# Patient Record
Sex: Male | Born: 1935 | Race: White | Hispanic: No | Marital: Married | Smoking: Former smoker
Health system: Southern US, Community
[De-identification: ages and names within clinical notes are randomized; demographics above are authoritative.]

## PROBLEM LIST (undated history)

## (undated) DIAGNOSIS — J449 Chronic obstructive pulmonary disease, unspecified: Secondary | ICD-10-CM

## (undated) DIAGNOSIS — S2239XA Fracture of one rib, unspecified side, initial encounter for closed fracture: Secondary | ICD-10-CM

## (undated) DIAGNOSIS — B029 Zoster without complications: Secondary | ICD-10-CM

## (undated) DIAGNOSIS — R296 Repeated falls: Secondary | ICD-10-CM

## (undated) DIAGNOSIS — K5190 Ulcerative colitis, unspecified, without complications: Secondary | ICD-10-CM

## (undated) DIAGNOSIS — K7290 Hepatic failure, unspecified without coma: Secondary | ICD-10-CM

## (undated) HISTORY — PX: ERCP: SHX60

## (undated) HISTORY — PX: OTHER SURGICAL HISTORY: SHX169

## (undated) HISTORY — PX: BACK SURGERY: SHX140

## (undated) HISTORY — PX: CHOLECYSTECTOMY: SHX55

---

## 2005-07-26 ENCOUNTER — Ambulatory Visit: Payer: Self-pay | Admitting: Internal Medicine

## 2005-08-15 ENCOUNTER — Encounter: Admission: RE | Admit: 2005-08-15 | Discharge: 2005-08-15 | Payer: Self-pay | Admitting: Gastroenterology

## 2005-09-29 ENCOUNTER — Ambulatory Visit: Payer: Self-pay | Admitting: Internal Medicine

## 2006-07-30 ENCOUNTER — Inpatient Hospital Stay (HOSPITAL_COMMUNITY): Admission: EM | Admit: 2006-07-30 | Discharge: 2006-07-31 | Payer: Self-pay | Admitting: Emergency Medicine

## 2006-07-31 ENCOUNTER — Ambulatory Visit: Payer: Self-pay | Admitting: *Deleted

## 2006-10-31 ENCOUNTER — Encounter: Admission: RE | Admit: 2006-10-31 | Discharge: 2006-10-31 | Payer: Self-pay | Admitting: Internal Medicine

## 2007-01-25 ENCOUNTER — Encounter: Admission: RE | Admit: 2007-01-25 | Discharge: 2007-01-25 | Payer: Self-pay | Admitting: Gastroenterology

## 2007-08-08 ENCOUNTER — Encounter: Admission: RE | Admit: 2007-08-08 | Discharge: 2007-08-08 | Payer: Self-pay | Admitting: Gastroenterology

## 2008-01-31 ENCOUNTER — Inpatient Hospital Stay (HOSPITAL_COMMUNITY): Admission: EM | Admit: 2008-01-31 | Discharge: 2008-02-01 | Payer: Self-pay | Admitting: Emergency Medicine

## 2008-01-31 ENCOUNTER — Ambulatory Visit: Payer: Self-pay | Admitting: Cardiology

## 2010-03-07 ENCOUNTER — Encounter: Payer: Self-pay | Admitting: Gastroenterology

## 2010-06-29 NOTE — Discharge Summary (Signed)
[name], [name]              ACCOUNT NO.:  [id number]   MEDICAL RECORD NO.:  [id number]          PATIENT TYPE:  INP   LOCATION:  4729                         FACILITY:  [facility]   PHYSICIAN:  Theodosia Paling, MD    DATE OF BIRTH:  Jun 18, 1935   DATE OF ADMISSION:  01/31/2008  DATE OF DISCHARGE:                               DISCHARGE SUMMARY   PRIMARY CARE PHYSICIAN:  Georgianne Fick, MD   ADMITTING HISTORY:  Please refer to the x-ray and admission note  dictated by Dr. Isidor Holts, dated 15th and 17th December 2009.   HOSPITAL COURSE:  Following issues were addressed during the  hospitalization:  1. Chest pain.  The patient underwent cardiac enzymes evaluation and      tele evaluation.  He became asymptomatic.  He also underwent a      stress test.  Dr. Vernice Jefferson, from Cardiology evaluated the patient.      At the time of discharge, the patient is hemodynamically stable.      He is asymptomatic.  He also underwent a CT scan to evaluate for      pulmonary embolism given his elevated D-dimer, which was also      negative.  Most likely, the pain is secondary to the      gastroesophageal acid reflux and the patient is recommended to      continue to take his Nexium and Maalox as needed along with      Percocet in case it is a musculoskeletal pain.  Incase, he were to      have similar symptoms again, he can report back to emergency room.      He will be following with Dr. Nicholos Johns for the above issues and      as per his previous schedule.  2. Cirrhosis, that remained compensated.  3. COPD.  His COPD is stable.  4. Ulcerative colitis.  He remained asymptomatic from that.   DISCHARGE MEDICATIONS:  The patient is instructed to continue his home  medications, which include:  1. Ursodiol 300 mg p.o. q.i.d.  2. Nexium 40 mg p.o. daily.  3. Citalopram 20 mg p.o. daily.  4. Fish oil 1200 mg p.o. daily.  5. Desioxomethasone 0.25% cream topically to lower extremities daily.  6. Apriso-ER 0.375 g p.o. q.i.d.  7. Entocort-EC 3 mg p.o. q.i.d.  8. Aridium 1 p.o. daily.   PROCEDURE PERFORMED:  Nuclear cardiac stress test, which was negative.   CONSULTATION PERFORMED:  Dr. Vernice Jefferson from Cardiology on February 01, 2008, evaluated the patient.   IMAGING PERFORMED:  1. A CT scan with IV contrast, which ruled out pulmonary embolism.  2. Chest x-ray on January 31, 2008, which was essentially negative.   DISPOSITION:  The patient will follow up with the primary care physician  as scheduled.   Total time spent in discharge of this patient, 35 minutes.   DISCHARGE DIAGNOSES:  1. Chest pain.  2. Cirrhosis.  3. Chronic obstructive pulmonary embolism.  4. Ulcerative colitis.      Theodosia Paling, MD  Electronically Signed     NP/MEDQ  D:  02/01/2008  T:  02/02/2008  Job:  [id number]   cc:   Georgianne Fick, M.D.

## 2010-06-29 NOTE — Discharge Summary (Signed)
[name], [name]              ACCOUNT NO.:  [id number]   MEDICAL RECORD NO.:  [id number]          PATIENT TYPE:  INP   LOCATION:  3703                         FACILITY:  [facility]   PHYSICIAN:  Marcellus Scott, MD     DATE OF BIRTH:  1935/02/20   DATE OF ADMISSION:  07/30/2006  DATE OF DISCHARGE:  07/31/2006                               DISCHARGE SUMMARY   PRIMARY MEDICAL DOCTOR:  Georgianne Fick, M.D.   GASTROENTEROLOGIST:  Llana Aliment. Randa Evens, M.D.   PULMONOLOGIST:  Charlaine Dalton. Sherene Sires, MD, FCCP.   DISCHARGE DIAGNOSES:  1. Orthostatic hypotension.  2. Chest discomfort.  3. Questionable right upper lobe pulmonary nodule - ruled out by CT      chest.  4. Acute prerenal failure.  5. Chronic obstructive pulmonary disease.  6. Tobacco abuse.  7. Dyslipidemia.  8. Abnormal LFTs, cirrhosis, fatty liver.  9. 6 mm pleural based nodule in right upper lobe- consider outpatient      evaluation as deemed necessary  (has been discussed with patients      PMD)  10.Abnormal areas in left kidney on CT scan - consider outpatient      evaluation   DISCHARGE MEDICATIONS:  1. Enteric-coated aspirin 81 mg p.o. daily.  2. Prilosec over-the-counter, 20 mg p.o. daily.   MEDICATIONS HELD:  Micardis.   PROCEDURES:  1. CT angiogram of the chest.  Preliminary report with no pulmonary      embolism, severe emphysema, cirrhotic liver, left renal abnormality      - renal ultrasound or CT recommended.  2. Chest x-ray that was done on June 15th.  Impression:  Nodular      opacity in the right upper lung field, recommend comparison with      prior or followup chest x-ray versus CT of the chest, mild      cardiomegaly.  3. Echocardiogram which was done on June 16th, the report of which is      pending and is to be followed up by the primary medical doctor as      an outpatient - Normal LV systolic function with EF 60-65% and mild      aortic regurgitation.   PERTINENT LABORATORY RESULTS:  Lipid panel  with cholesterol of 250,  triglycerides 130, HDL 39, LDL 185, VLDL 26.  A cardiac panel with  negative CK-MB and troponin.  CPK 340, which is decreasing from 346.  A  complete metabolic panel normal with a BUN of 17 and creatinine of 1.  The patient had a creatinine of 1.5 on admission.  TSH of 1.418.  Hepatic panel remarkable for a total bilirubin of 1.6, alkaline  phosphatase of 405, AST 51, ALT 57, total protein 7.5, albumin 3.2.  BNP  of 80.  D-dimer 1.  Urinalysis with 30 mg/dL of protein.  CBC with a  hemoglobin of 15, hematocrit of 45, white blood cells 13, platelet count  of 210, MCV of 87.   CONSULTATIONS:  None.   HOSPITAL COURSE AND PATIENT DISPOSITION:  For details of the initial  part of the admission, please refer to the  history and physical note  that was done by Dr. Waymon Amato on 30 July 2006.  In summary, [name]  is a pleasant, 75 year old, male patient with a history of hypertension,  dyslipidemia, chronic obstructive pulmonary disease, chronic smoker with  a strong family history of smoking and lung cancer deaths.  He presented  with a history of dizziness, lightheadedness, blurred vision, chest  discomfort, and dyspnea while playing golf.  He was evaluated and noted  to have orthostatic hypotension, whereby he was admitted for further  evaluation and management.   1. Orthostatic hypotension.  The patient had not been eating or      drinking properly for 12 hours prior to the event.  He had      continued to take his Micardis, and he was also out playing golf      with his family on Father's Day in the hot and humid weather.  This      could have contributed to dehydration with associated orthostatic      hypotension.  The patient was admitted to the hospital to a      telemetry bed and had no alarms.  His Micardis was held.  He was      placed on IV fluid hydration with which his hypotension promptly      resolved.  The patient is currently asymptomatic of any  dizziness,      lightheadedness, or blurred vision and is ambulating without      assistance.  His Micardis will continue to be held until he is      reviewed by his primary care physician in a couple of days.  2. Chest discomfort.  With his history of dyslipidemia, chronic      smoking, he had some risk factors of cardiac disease, so he was      admitted to the hospital.  Cardiac enzymes were cycled which were      not consistent with an acute MI.  His D-dimer was done because of      his recent travel history to [place] and back via car.  His D-dimer      was positive, whereby a CT angiogram was done and it was negative      for pulmonary embolism.  I have discussed the CT angiogram report      with the radiologist today, who also indicated that he does not see      the right upper lobe pulmonary nodule which was reported on the      chest x-ray and he does not suggest a repeat CT of the chest at      this time.  Also, I have discussed with the patient's primary care      physician who indicated that a recent stress test done 3 weeks ago      was negative and the patient was able to do 10 minutes of exercise.      The patient's echocardiogram has been done today, the report of      which is pending and is to be followed up as an outpatient.  The      patient will be discharged on enteric-coated aspirin.  3. Right upper lobe questionable lung nodule which was ruled out by a      CT of the chest.  4. Acute prerenal failure secondary to causes mentioned in problem #1,      resolved with IV fluid hydration and holding his Micardis.  The  patient is advised to drink adequate amounts of liquids and his      basic metabolic panel will have to be followed up as an outpatient.  5. COPD which is stable.  6. Tobacco abuse.  Tobacco cessation counseling has been obtained.      According to the patient's spouse, however, the patient does not      want to quit smoking.  7. Dyslipidemia.  The  patient obviously would benefit from lipid-      lowering agents, like Statins, however, these to be started as an      outpatient because of his mildly elevated CK and his abnormal liver      function test.  8. Abnormal LFTs, cirrhosis, fatty liver.  The patient says that he      has been extensively evaluated in the past at the [facility] in      2004, with multiple ERCPs and biopsies and has chronically elevated      alkaline phosphatase and mildly abnormal liver function test which      he has been told to be due to fatty liver.  We will leave this to      the discretion of his primary physicians to work up as an      outpatient.  9. Right Upper Lobe Pleural based nodule on CT chest - for outpatient      workup as deemed necessary. This has been discussed with patients      PMD.  10.Abnormal areas in left kidney on CT scan chest - outpatient      evaluation as deemed necessary which may include ultrasound or CT      abdomen.   A Doppler of his lower extremities will be performed and if negative,  the patient will be discharged to follow up with his primary care  physician.  The patient has also been instructed to seek immediate  medical attention if there is any further deterioration in his  condition.  Venous Doppler of lower extremities negative for DVT.      Marcellus Scott, MD  Electronically Signed     AH/MEDQ  D:  07/31/2006  T:  07/31/2006  Job:  [id number]   cc:   Georgianne Fick, M.D.  James L. Malon Kindle., M.D.  Charlaine Dalton. Sherene Sires, MD, FCCP

## 2010-06-29 NOTE — Consult Note (Signed)
[name], [name]              ACCOUNT NO.:  [id number]   MEDICAL RECORD NO.:  [id number]          PATIENT TYPE:  INP   LOCATION:  4729                         FACILITY:  [facility]   PHYSICIAN:  Vernice Jefferson, MD          DATE OF BIRTH:  November 14, 1935   DATE OF CONSULTATION:  02/01/2008  DATE OF DISCHARGE:                                 CONSULTATION   CONSULTING Lismary Kiehn:  [facility].   REASON FOR CONSULTATION:  The patient is being seen at the request of  [facility] for evaluation of chest pain.   HISTORY OF PRESENT ILLNESS:  The patient is a 75 year old white male  with negative stress test in the past for atypical chest pain who comes  in with 3 days onset of chest pain that had woken him from sleep.  He  reports that he has left arm radiation with it, some shortness of  breath, minimally diaphoretic.  He reports that he has not had this  chest discomfort before, say for the past 3 days.  He currently is chest  pain free.  He denies any exertional component to this chest pain and  has been recently ill due to exacerbation of his ulcerative colitis and  frequent bouts of diarrhea.   PAST MEDICAL HISTORY:  1. Hypertension.  2. Ulcerative colitis.  3. COPD.  4. Cirrhosis of liver.  5. GERD.  6. Status post cholecystectomy.   MEDICATIONS:  1. Ursodiol 300 mg q.i.d.  2. Nexium 40 mg a day.  3. Citalopram 20 mg a day.  4. Fish oil 1.2 g a day.  5. Dexamethasone cream applied topically.  6. Entocort 3 q.i.d.   ALLERGIES:  AUGMENTIN, EGGS, and STATINS.   SOCIAL HISTORY:  Nonsmoker, nondrinker, non IV drug user.   FAMILY HISTORY:  Reviewed, noncontributory.  The patient had no medical  condition.   REVIEW OF SYSTEMS:  Negative 11-point review of systems except for those  dictated in the above HPI.   PHYSICAL EXAMINATION:  VITAL SIGNS:  Blood pressure is 127/63, heart  rate 60, respirations 12.  GENERAL:  A well-developed, well-nourished white  man in no acute  distress.  HEENT:  Moist mucous membranes. No scleral icterus.  No conjunctival  pallor.  NECK:  Supple.  Full range of motion.  No jugular venous distention.  CARDIOVASCULAR:  Regular rate and rhythm.  No murmurs, rubs, or gallops.  CHEST:  Clear to auscultation bilaterally.  No wheezes, rales, or  rhonchi.  ABDOMEN: Soft, nontender, nondistended.  Normoactive bowel sounds.  EXTREMITIES:  No peripheral edema.  Pulses are 2+ bilaterally.  NEUROLOGIC:  Nonfocal.   DIAGNOSTIC STUDIES:  The EKG demonstrates normal sinus rhythm with  incomplete right bundle-branch block which is unchanged for him.  There  is no acute ST or T-wave abnormalities.  Chest x-ray demonstrates no  acute infiltrative process or chronic changes.   LABORATORY DATA:  Significant for negative biomarkers.  BUN and  creatinine 12 and 0.9.  Hemoglobin of 12.9.   IMPRESSION:  1. Acute coronary syndrome, unstable angina.  2.  Inflammatory bowel disease.  3. History of cirrhosis.   PLAN:  I would agree with your current plans for following enzymes and  the telemetry monitoring.  We will hold heparin at this time and  although he does may have an acute coronary syndrome given his bleeding  risk from his cirrhosis and ultrasound colitis.  I think it probably  outweighs the benefit given his 3 days of chest pain with negative  biomarkers.  We would consider adenosine Myoview in the morning before  discharge, if negative, he can likely just followup for other noncardiac  causes of chest discomfort.  We will order an a.m. ECG.   Thank you very much for this consultation.      Vernice Jefferson, MD  Electronically Signed     JT/MEDQ  D:  02/01/2008  T:  02/01/2008  Job:  [id number]

## 2010-06-29 NOTE — H&P (Signed)
[name], [name]              ACCOUNT NO.:  [id number]   MEDICAL RECORD NO.:  [id number]          PATIENT TYPE:  INP   LOCATION:  4729                         FACILITY:  [facility]   PHYSICIAN:  Isidor Holts, M.D.  DATE OF BIRTH:  [birth year]   DATE OF ADMISSION:  01/31/2008  DATE OF DISCHARGE:                              HISTORY & PHYSICAL   PRIORITY ADMISSION HISTORY AND PHYSICAL   PRIMARY MEDICAL DOCTOR:  Georgianne Fick, MD.   PRIMARY GASTROENTEROLOGIST:  Llana Aliment. Randa Evens, MD.  Gastroenterologist at [facility] [place] D. Katrinka Blazing,  MD.   CHIEF COMPLAINT:  Recurrent chest pain for 3 days.   HISTORY OF PRESENT ILLNESS:  This is a 75 year old male.  For past  medical history, see below.  According to patient, in the night of  January 29, 2008, he had just gone to bed and was lying in bed.  Before  falling asleep, he developed the sudden onset of retrosternal chest  pain, radiating to the left arm and to the back.  This was associated  with mild diaphoresis, but no shortness of breath or nausea.  It lasted  approximately 1 hour, then resolved.  He remained asymptomatic until the  following night, when the same symptoms recurred in almost exactly the  same manner and duration.  This also subsided spontaneously.  At about  3:00 p.m. on January 31, 2008, while sitting in a chair, he suddenly  developed similar chest discomfort with radiation to the left arm.  This  lasted about 1 hour.  He informed his spouse, who then called EMS.  He  was administered 1 sublingual Nitroglycerin pill in the home, another en  route to the emergency department, and 1 on arrival at the emergency  department, with considerable amelioration of chest discomfort.  At the  time of this evaluation at about 10:30 p.m., the patient had residual  central chest discomfort, albeit very mild.   PAST MEDICAL HISTORY:  1. Hypertension.  2. Dyslipidemia.  3. COPD.  4. End-stage liver  disease/cirrhosis status post extensive workup at      [facility] including ERCP and      liver biopsy.  Etiology was ascribed to Augmentin, which he had      been placed on, in 1999.  5. GERD.  6. Ulcerative colitis, diagnosed approximately 2 weeks ago by      colonoscopy and biopsy.  7. Status post cholecystectomy.  8. Macular degeneration.  9. Eczema lower extremities.   MEDICATION HISTORY:  1. Ursodiol 300 mg p.o. q.i.d.  2. Nexium 40 mg p.o. daily.  3. Citalopram 20 mg p.o. daily.  4. Fish oil 1200 mg p.o. daily.  5. Desioxomethasone 0.25% cream topically to lower extremities daily.  6. Apriso-ER 0.375 g p.o. q.i.d.  7. Entocort-EC 3 mg p.o. q.i.d.  8. Aridium 1 p.o. daily.   ALLERGIES:  1. AUGMENTIN.  2. EGGS.  3. STATINS.   REVIEW OF SYSTEMS:  As per HPI and chief complaint.  The patient denies  abdominal pain, vomiting, or diarrhea.  Denies lower extremity  swelling.   SOCIAL HISTORY:  The patient is married.  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  He has been a long term smoker for approximately 55 years,  but has now quit smoking a couple of years ago.  He drinks alcohol only  socially, has no history of drug abuse.   FAMILY HISTORY:  Positive for COPD in a brother, and a sister, who is  now deceased.  The patient's father, mother, and grandmother died of  lung cancer.  They were all smokers.  The patient's daughter died at age  82 from Wilson's disease.   PHYSICAL EXAMINATION:  VITAL SIGNS:  Temperature 97.6, pulse 60 per  minute and regular, respiratory rate 15, BP 127/63 mmHg, pulse oximeter  99% on 3 L of oxygen.  GENERAL:  The patient did not appear to be in obvious acute distress at  the time of this evaluation, alert and oriented to person and place.  HEENT:  No clinical pallor, no jaundice, no conjunctival injection,  throat is clear.  NECK:   Supple, JVP not seen, no palpable lymphadenopathy, no palpable  goiter.  CHEST:  Clinically clear to auscultation, no wheezes, no crackles.  CARDIAC:  Heart sounds 1 and 2 heard, normal, regular, and no murmurs.  ABDOMEN:  Full, soft, and nontender.  Patient has a clinical ascites.  Bowel sounds are heard.  LOWER EXTREMITIES:  No pitting edema, palpable peripheral pulses.  MUSCULOSKELETAL:  Mild osteoarthritic changes.  CENTRAL NERVOUS SYSTEM:  No focal neurologic deficit on gross  examination.   INVESTIGATIONS:  CBC:  WBC 7.4, hemoglobin 12.9, hematocrit 39.0,  platelets 164.  Electrolytes:  Sodium 113, potassium 3.9, chloride 104,  CO2 25, BUN 12, creatinine 0.9, glucose 94.  Troponin-I of less than  0.05.  Chest x-ray dated January 31, 2008, showed COPD with no acute  cardiopulmonary disease.  12-lead EKG dated January 31, 2008, showed  sinus rhythm regular at 60 per minute, normal axis, partial right bundle  branch block pattern, and no acute ischemic changes.  EKG is unchanged  from that of July 30, 2006.   ASSESSMENT AND PLAN:  1. Chest pain.  The patient has no ischemic changes on 12-lead      electrocardiogram, cardiac enzymes are normal so far, however, the      chest pain sounds very typical for coronary artery disease and      patient does indeed have risk factors.  We shall commence him on      Nitropaste, oxygen supplementation, monitor with telemetry, we will      cycle cardiac enzymes, do a D-dimer and echocardiogram.      Furthermore, we shall consult a cardiologist for risk      stratification.  We have already contacted Dr. Tawanna Cooler, [facility]      [facility].   1. Ulcerative colitis.  This is a recent diagnosis.  The patient is      currently symptomatic.  We shall continue pre-admission      medications.   1. Chronic obstructive pulmonary disease.  Currently stable.   1. Chronic liver disease.  Appears clinically compensated.   1. Gastroesophageal reflux  disease.  We shall manage with proton pump      inhibitor.   Further management will depend on clinical course.      Isidor Holts, M.D.  Electronically Signed     CO/MEDQ  D:  01/31/2008  T:  02/01/2008  Job:  [id number]   cc:   Georgianne Fick, M.D.  James L. Malon Kindle., M.D.  Theador Hawthorne. Katrinka Blazing, M.D.

## 2010-06-29 NOTE — H&P (Signed)
NAMESTACIE, KNUTZEN NO.:  [id number]   MEDICAL RECORD NO.:  [id number]          PATIENT TYPE:  INP   LOCATION:  1825                         FACILITY:  [facility]   PHYSICIAN:  Marcellus Scott, MD     DATE OF BIRTH:  04/02/1935   DATE OF ADMISSION:  07/30/2006  DATE OF DISCHARGE:                              HISTORY & PHYSICAL   PRIMARY MEDICAL DOCTOR:  Georgianne Fick, M.D.   GASTROENTEROLOGY:  Dr. Randa Evens.   PULMONOLOGIST:  Dr. Charlaine Dalton. Sherene Sires, MD, FCCP.   CHIEF COMPLAINT:  1. Dizziness.  2. Lightheadedness.  3. Dyspnea.  4. Chest tightness.   HISTORY OF PRESENTING ILLNESS:  Mr. Cousineau is a pleasant 75 year old  Caucasian male patient with past medical history indicated as below.  Over the last 3 months, the patient has been complaining of feeling  generally tired, anxious, secondary to personal worry and insomnia.  However, otherwise he was in his usual state of health, until this  afternoon.  The patient last ate at 6:00 p.m. last night and this  morning just had a cup a coffee and proceeded to play golf with his  family at approximately 11:00 a.m.  At about 1:30 p.m., while at the  fifth hole of golf, the patient experienced lightheadedness, dizziness,  fogginess of the eyes, some retrosternal right-sided chest tightness  with no radiation, or dyspnea.  Following this, he stopped playing and  proceeded to sit in the golf cart, whereby his dyspnea and chest  tightness resolved after about 15 to 20 minutes.  His son subsequently  drove him home; however, patient continued to feel lightheaded and  dizzy.  His wife checked his blood pressure at home and were noted to be  84/53 and persistently had systolics in the 80s.  The patient drank an  electrolyte drink and subsequently was brought to the emergency room by  his family.  In the emergency room, patient has received bolus of IV  fluids and says he does not have any chest tightness or dyspnea  anymore.  His lightheadedness and dizziness and blurred vision are significantly  better.   PAST MEDICAL HISTORY:  1. Hypertension.  2. Dyslipidemia.  3. Chronic obstructive pulmonary disease.  4. History jaundice between 1999 to 2004, attributed to Augmentin side      effect.  5. Heartburn.  6. Colonoscopy  and EGD three years ago, set to be negative.   PAST SURGICAL HISTORY:  Status post cholecystectomy.   PSYCHIATRIC HISTORY:  None.   MEDICATIONS:  1. Micardis, unknown strength, one p.o. daily.  2. Pepcid p.r.n.   ALLERGIES:  1. AUGMENTIN CAUSES JAUNDICE  2. FLU SHOT.   FAMILY HISTORY:  1. A brother with history of COPD.  2. A sister who died of COPD.  3. Patient's father, mother, and grandmother died of lung cancer, and      all were smokers.  4. Patient's daughter died at age 97 from Wilson's disease.   SOCIAL HISTORY:  The patient is married.  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  He has been  retired for 10  years.  The patient is a chronic smoker for 55 years.  Initially, he  used to smoke two packs of cigarettes per day, until 10 to 15 years ago,  when he decreased to one pack a day, and for the last 7 years has been  smoking about 4 to 5 cigars daily.  Social alcohol consumption, and no  history of drug abuse.   REVIEW OF SYSTEMS:  1. Over 10 systems reviewed and pertinent for #1.  His sense of left-      sided neck muscle spasms intermittently when he opens his jaw.  2. History of anxiousness and insomnia, secondary to worrying about      his family issues.  3. Weight gain of 20 pounds over the last three months.  4. Patient with a good effort tolerance.  5. Patient made a recent trip to and back from [place], when he drove      and returned 5 days ago.   PHYSICAL EXAMINATION:  GENERAL:  Mr. Mclester is a moderately built and  nourished male patient in no obvious distress.  VITAL SIGNS:  Temperature 97.2 degrees Fahrenheit.   Blood pressure on  arrival was 112/64 lying, 111/63 sitting and 80/55 standing, heart rate  of 80/mint lying, 80/min sitting and 85 per minute standing, respiration  of 20/min and saturating between 95 and 98% on room air.  HEENT:  Nontraumatic, normocephalic.  Pupils equally reactive to light  and accommodation.  The patient is missing a few teeth and other teeth  with fillings, no pharyngeal erythema.  NECK:  With no JVD, carotid bruit, lymphadenopathy or goiter.  Supple.  GENERAL:  No lymphadenopathy.  RESPIRATORY SYSTEM:  Clear to auscultation bilaterally.  CARDIOVASCULAR:  First and second heart sounds are heard, soft.  No  third or fourth heart sounds or murmurs or rubs or gallops or clicks.  ABDOMEN:  Nondistended, nontender, no organomegaly or masses  appreciated.  Bowel sounds are normally heard.  CENTRAL NERVOUS SYSTEM:  Patient is awake, alert, oriented x3 with no  focal neurological deficits.  EXTREMITIES:  No cyanosis, clubbing or edema.  Peripheral pulses are  symmetrically felt.  SKIN:  Without any rashes.   LAB RESULTS:  Point of care cardiac markers only remarkable for  myoglobin of 254, which was 223 initially, troponins x2 and CK-MB x2,  negative.  Creatinine of 1.5, venous blood pH 7.368, PCO2 of 42.7,  bicarb of 24.6, hemoglobin and hematocrit of 16.7 and 49.  Basic  metabolic panel:  Sodium of 133, potassium 4.3, chloride 104, glucose  92, BUN 17.  EKG with normal sinus rhythm at 72 beats per minute, with  normal axis, incomplete right bundle branch block.  No other acute  ischemic changes.  Chest x-ray has been reviewed by me and reported as a  nodular opacity in the right upper lobe.  Radiologist recommend compare  with prior followup chest x-ray and a CT of the chest.   ASSESSMENT/PLAN:  1. Orthostatic hypotension.  This is probably multifactorial,      secondary to patient's poor oral intake, the exposure to heat and      humidity, Micardis and associated  dehydration.  Will hold the      patient's Micardis.  Will place patient on IV fluid hydration and      monitor patient's blood pressure.  2. Chest tightness and dyspnea, rule out acute coronary syndrome.      Will admit patient to telemetry bed,  will cycle patient's cardiac      enzymes and check D-dimer.  The patient will need a CT of the chest      to rule out cancer, with strong family history of cancer, smoking      history and a chest x-ray suggestive of nodule.  This to be done,      once patient is adequately hydrated, and his renal functions      improve.  However, if the D-dimer comes back positive, to consider      an earlier CT angiogram of the chest.  Will place patient on      aspirin.  3. Mild acute renal failure, probably secondary to dehydration and      Micardis.  Again, will hold ARB and place patient on IV fluids and      follow patient's basic metabolic panel.  4. Chronic obstructive pulmonary disease which is stable.  5. Generalized tiredness.  Will check patient's TSH and echocardiogram      for LV function.  Will also follow up stress results with PMD in      the morning.  6. Tobacco abuse for counseling.  7. Dyslipidemia.  Will check fasting lipids.  8. Hyponatremia secondary to dehydration and ARB - for IV normal      saline hydration and follow up a BMET.  9. Right upper lobe lung nodule - for CT of the chest, to rule out      cancer.      Marcellus Scott, MD  Electronically Signed     AH/MEDQ  D:  07/30/2006  T:  07/30/2006  Job:  [id number]   cc:   Georgianne Fick, M.D.  James L. Malon Kindle., M.D.  Charlaine Dalton. Sherene Sires, MD, FCCP

## 2010-07-02 NOTE — Assessment & Plan Note (Signed)
[facility]                               PULMONARY OFFICE NOTE   [name], [name]                       MRN:          [id number]  DATE:09/29/2005                            DOB:          1935/04/25    This is a pulmonary/final followup office visit.   HISTORY:  This is a 75 year old white male who was actively smoking until he  quit in June, and was concerned that he might be developing COPD or  emphysema just like his sister and brother.  Since the patient stopped  smoking, he is not coughing and also having no significant limiting dyspnea  at all.  That is, he says he can swim all day long, walk up to a mile and  play golf without any difficulty.   MEDICATIONS:  He is on no medications at present.   PHYSICAL EXAMINATION:  GENERAL:  He is a pleasant, ambulatory white male,  who has gained 6 pounds since previous visit, up to 199 now with a blood  pressure of 150/92.  HEENT:  Unremarkable, oropharynx is clear.  LUNGS:  Lung fields are completely clear bilaterally to auscultation and  percussion.  CARDIAC:  There is a regular rhythm without murmur, gallop or rub.  ABDOMEN:  Soft, benign.  EXTREMITIES:  Warm without calf tenderness, cyanosis, clubbing or edema.   LABORATORY DATA:  Spirometry was reviewed with the patient, indicates an  FEV1/FVC ratio of 49%, but his actual FEV1 is 89%, improves 11% on  bronchodilators with no significant drop in diffusion capacity.   IMPRESSION:  This patient has chronic obstructive pulmonary disease with  minimal asthmatic component that is below the radar screen at present, and  probably will remain so as long as he does not smoke.  He is prone to  exacerbations of asthmatic bronchitis with upper respiratory tract  infections, or perhaps during allergy season, and therefore I recommended  the following:  1. No form of maintenance therapy is needed for now, other than to      maintain off cigarettes, and  I emphasized this point repeatedly to him.  2. The natural history of chronic obstructive pulmonary disease in mild      patients is not to progress, and I would not assume that in the future      chronic dyspnea is related to chronic obstructive pulmonary disease      without a followup spirometry.  (For instance, one example would be if      progressive weight gain that sometimes occurs with smoking will result      in progressive dyspnea that is not related to chronic obstructive      pulmonary disease).  3. And finally, during exacerbations I think he should be able to take      Mucinex DM 2 b.i.d. p.r.n. and Combivent 2 puffs q. 4 h. (metered-dose      inhalation technique reviewed) for symptomatic relief of cough and      dyspnea respectively.   He would like to return here in a year for reassurance that he is not  losing ground.  I emphasized to him again the point was that he will not,  as long as he maintains off cigarettes.  However, I would be happy to see  him in a year at his request, and in the meantime p.r.n.                                   Charlaine Dalton. Sherene Sires, MD, [facility]   MBW/MedQ  DD:  09/29/2005  DT:  09/30/2005  Job #:  [id number]   cc:   Joycelyn Rua, MD

## 2010-11-19 LAB — CBC
Hemoglobin: 12.9 g/dL — ABNORMAL LOW (ref 13.0–17.0)
MCV: 87.1 fL (ref 78.0–100.0)
RBC: 4.48 MIL/uL (ref 4.22–5.81)
RDW: 14.6 % (ref 11.5–15.5)
WBC: 7.4 10*3/uL (ref 4.0–10.5)

## 2010-11-19 LAB — LIPID PANEL
Cholesterol: 175 mg/dL (ref 0–200)
Total CHOL/HDL Ratio: 4.5 RATIO
VLDL: 14 mg/dL (ref 0–40)

## 2010-11-19 LAB — POCT I-STAT, CHEM 8
BUN: 12 mg/dL (ref 6–23)
Chloride: 104 mEq/L (ref 96–112)
Glucose, Bld: 94 mg/dL (ref 70–99)
Potassium: 3.9 mEq/L (ref 3.5–5.1)
Sodium: 138 mEq/L (ref 135–145)
TCO2: 25 mmol/L (ref 0–100)

## 2010-11-19 LAB — DIFFERENTIAL
Basophils Absolute: 0 10*3/uL (ref 0.0–0.1)
Eosinophils Relative: 2 % (ref 0–5)
Lymphocytes Relative: 15 % (ref 12–46)
Lymphs Abs: 1.1 10*3/uL (ref 0.7–4.0)
Monocytes Relative: 12 % (ref 3–12)
Neutro Abs: 5.2 10*3/uL (ref 1.7–7.7)

## 2010-11-19 LAB — TSH: TSH: 1.625 u[IU]/mL (ref 0.350–4.500)

## 2010-11-19 LAB — CK TOTAL AND CKMB (NOT AT ARMC): Total CK: 480 U/L — ABNORMAL HIGH (ref 7–232)

## 2010-11-19 LAB — TROPONIN I: Troponin I: 0.02 ng/mL (ref 0.00–0.06)

## 2010-11-19 LAB — D-DIMER, QUANTITATIVE: D-Dimer, Quant: 1.82 ug/mL-FEU — ABNORMAL HIGH (ref 0.00–0.48)

## 2010-12-01 LAB — POCT CARDIAC MARKERS
CKMB, poc: 1.1
CKMB, poc: 1.5
Myoglobin, poc: 159
Myoglobin, poc: 254
Operator id: 272551
Troponin i, poc: <0.05

## 2010-12-01 LAB — COMPREHENSIVE METABOLIC PANEL
AST: 51 — ABNORMAL HIGH
Albumin: 3.2 — ABNORMAL LOW
Alkaline Phosphatase: 405 — ABNORMAL HIGH
BUN: 16
CO2: 23
Calcium: 9.4
Chloride: 100
Creatinine, Ser: 1.42
Potassium: 4.3
Total Bilirubin: 1.6 — ABNORMAL HIGH
Total Protein: 7.5

## 2010-12-01 LAB — BASIC METABOLIC PANEL
CO2: 25
Calcium: 9.1
Glucose, Bld: 95
Potassium: 4.3
Sodium: 137

## 2010-12-01 LAB — CK TOTAL AND CKMB (NOT AT ARMC)
Relative Index: 0.5
Total CK: 346 — ABNORMAL HIGH

## 2010-12-01 LAB — DIFFERENTIAL
Basophils Absolute: 0.3 — ABNORMAL HIGH
Basophils Relative: 2 — ABNORMAL HIGH
Eosinophils Relative: 3
Monocytes Absolute: 1.1 — ABNORMAL HIGH

## 2010-12-01 LAB — URINALYSIS, MICROSCOPIC ONLY
Glucose, UA: NEGATIVE
Ketones, ur: NEGATIVE
Leukocytes, UA: NEGATIVE
Protein, ur: 30 — AB

## 2010-12-01 LAB — CBC
HCT: 45.6
MCV: 87.3
Platelets: 210
RDW: 14.3 — ABNORMAL HIGH

## 2010-12-01 LAB — I-STAT 8, (EC8 V) (CONVERTED LAB)
Acid-base deficit: 1
HCT: 49
Hemoglobin: 16.7
Operator id: 151321
Potassium: 4.3
Sodium: 133 — ABNORMAL LOW
TCO2: 26

## 2010-12-01 LAB — LIPID PANEL
LDL Cholesterol: 185 — ABNORMAL HIGH
Triglycerides: 130

## 2010-12-01 LAB — CARDIAC PANEL(CRET KIN+CKTOT+MB+TROPI)
CK, MB: 2
Relative Index: 0.6

## 2010-12-01 LAB — D-DIMER, QUANTITATIVE: D-Dimer, Quant: 1 — ABNORMAL HIGH

## 2012-02-10 ENCOUNTER — Emergency Department (HOSPITAL_COMMUNITY)
Admission: EM | Admit: 2012-02-10 | Discharge: 2012-02-10 | Disposition: A | Discharge status: Home / Self Care | Payer: Medicare Other | Attending: Emergency Medicine | Admitting: Emergency Medicine

## 2012-02-10 ENCOUNTER — Emergency Department (HOSPITAL_COMMUNITY): Payer: Medicare Other

## 2012-02-10 ENCOUNTER — Encounter (HOSPITAL_COMMUNITY): Payer: Self-pay | Admitting: *Deleted

## 2012-02-10 DIAGNOSIS — J4489 Other specified chronic obstructive pulmonary disease: Secondary | ICD-10-CM | POA: Insufficient documentation

## 2012-02-10 DIAGNOSIS — N201 Calculus of ureter: Secondary | ICD-10-CM | POA: Insufficient documentation

## 2012-02-10 DIAGNOSIS — Z8719 Personal history of other diseases of the digestive system: Secondary | ICD-10-CM | POA: Insufficient documentation

## 2012-02-10 DIAGNOSIS — R300 Dysuria: Secondary | ICD-10-CM | POA: Insufficient documentation

## 2012-02-10 DIAGNOSIS — J449 Chronic obstructive pulmonary disease, unspecified: Secondary | ICD-10-CM | POA: Insufficient documentation

## 2012-02-10 DIAGNOSIS — F17200 Nicotine dependence, unspecified, uncomplicated: Secondary | ICD-10-CM | POA: Insufficient documentation

## 2012-02-10 DIAGNOSIS — R112 Nausea with vomiting, unspecified: Secondary | ICD-10-CM | POA: Insufficient documentation

## 2012-02-10 HISTORY — DX: Chronic obstructive pulmonary disease, unspecified: J44.9

## 2012-02-10 HISTORY — DX: Hepatic failure, unspecified without coma: K72.90

## 2012-02-10 LAB — CBC WITH DIFFERENTIAL/PLATELET
Basophils Absolute: 0 K/uL (ref 0.0–0.1)
Basophils Relative: 0 % (ref 0–1)
Eosinophils Absolute: 0.4 K/uL (ref 0.0–0.7)
Eosinophils Relative: 4 % (ref 0–5)
HCT: 39.9 % (ref 39.0–52.0)
Hemoglobin: 13.4 g/dL (ref 13.0–17.0)
Lymphocytes Relative: 13 % (ref 12–46)
Lymphs Abs: 1.3 K/uL (ref 0.7–4.0)
MCH: 29.1 pg (ref 26.0–34.0)
MCHC: 33.6 g/dL (ref 30.0–36.0)
MCV: 86.7 fL (ref 78.0–100.0)
Monocytes Absolute: 1.1 K/uL — ABNORMAL HIGH (ref 0.1–1.0)
Monocytes Relative: 11 % (ref 3–12)
Neutro Abs: 7.5 K/uL (ref 1.7–7.7)
Neutrophils Relative %: 72 % (ref 43–77)
Platelets: 190 K/uL (ref 150–400)
RBC: 4.6 MIL/uL (ref 4.22–5.81)
RDW: 15.3 % (ref 11.5–15.5)
WBC: 10.4 K/uL (ref 4.0–10.5)

## 2012-02-10 LAB — URINALYSIS, ROUTINE W REFLEX MICROSCOPIC
Bilirubin Urine: NEGATIVE
Glucose, UA: NEGATIVE mg/dL
Ketones, ur: NEGATIVE mg/dL
Nitrite: NEGATIVE
Protein, ur: NEGATIVE mg/dL
Specific Gravity, Urine: 1.02 (ref 1.005–1.030)
Urobilinogen, UA: 1 mg/dL (ref 0.0–1.0)
pH: 6 (ref 5.0–8.0)

## 2012-02-10 LAB — COMPREHENSIVE METABOLIC PANEL WITH GFR
ALT: 19 U/L (ref 0–53)
AST: 38 U/L — ABNORMAL HIGH (ref 0–37)
Albumin: 2.6 g/dL — ABNORMAL LOW (ref 3.5–5.2)
Alkaline Phosphatase: 240 U/L — ABNORMAL HIGH (ref 39–117)
BUN: 14 mg/dL (ref 6–23)
CO2: 28 meq/L (ref 19–32)
Calcium: 9.4 mg/dL (ref 8.4–10.5)
Chloride: 96 meq/L (ref 96–112)
Creatinine, Ser: 0.89 mg/dL (ref 0.50–1.35)
GFR calc Af Amer: >90 mL/min (ref >90)
GFR calc non Af Amer: 82 mL/min — ABNORMAL LOW (ref >90)
Glucose, Bld: 110 mg/dL — ABNORMAL HIGH (ref 70–99)
Potassium: 4.4 meq/L (ref 3.5–5.1)
Sodium: 130 meq/L — ABNORMAL LOW (ref 135–145)
Total Bilirubin: 1.3 mg/dL — ABNORMAL HIGH (ref 0.3–1.2)
Total Protein: 7.7 g/dL (ref 6.0–8.3)

## 2012-02-10 LAB — URINE MICROSCOPIC-ADD ON

## 2012-02-10 MED ORDER — OXYCODONE HCL 5 MG PO TABS: 5.0000 mg | ORAL_TABLET | Freq: Four times a day (QID) | ORAL | Status: DC | PRN

## 2012-02-10 MED ORDER — ONDANSETRON HCL 4 MG/2ML IJ SOLN
4.0000 mg | Freq: Once | INTRAMUSCULAR | Status: AC
  Administered 2012-02-10: 4 mg via INTRAVENOUS
  Filled 2012-02-10: qty 2

## 2012-02-10 MED ORDER — PROMETHAZINE HCL 25 MG PO TABS: 25.0000 mg | ORAL_TABLET | Freq: Four times a day (QID) | ORAL | Status: DC | PRN

## 2012-02-10 MED ORDER — MORPHINE SULFATE 4 MG/ML IJ SOLN
6.0000 mg | Freq: Once | INTRAMUSCULAR | Status: AC
  Administered 2012-02-10: 6 mg via INTRAVENOUS
  Filled 2012-02-10: qty 2

## 2012-02-10 NOTE — ED Notes (Signed)
Patient transported to CT 

## 2012-02-10 NOTE — ED Provider Notes (Addendum)
History     CSN: [id number]  Arrival date & time 02/10/12  0609   First MD Initiated Contact with Patient 02/10/12 [phone]      Chief Complaint  Patient presents with  . Flank Pain  . Dysuria    (Consider location/radiation/quality/duration/timing/severity/associated sxs/prior treatment) HPI Patient presents to the emergency department with right flank pain, that began at 12 midnight.  Patient, states that he's had some nausea, and one episode of vomiting.  Patient denies chest pain, shortness of breath, diarrhea, fever, headache, back pain, or syncope.  Patient, states that he does have some burning in his penis and difficulty urinating.  Patient, states he hadn't been able to urinate successfully until he arrived at the ER.  Patient denies taking anything prior to arrival, for his symptoms.  Patient, states nothing seems to make his symptoms better or worse.  Patient, states that he's never had a history of kidney stones. Past Medical History  Diagnosis Date  . COPD (chronic obstructive pulmonary disease)   . Liver failure     Past Surgical History  Procedure Date  . Cholecystectomy   . Ercp   . Hemmrhoid     repair    No family history on file.  History  Substance Use Topics  . Smoking status: Current Some Day Smoker    Types: Cigarettes  . Smokeless tobacco: Not on file  . Alcohol Use: Yes     Comment: occasional      Review of Systems All other systems negative except as documented in the HPI. All pertinent positives and negatives as reviewed in the HPI.  Allergies  Augmentin; Eggs or egg-derived products; and Statins  Home Medications  No current outpatient prescriptions on file.  BP 127/57  Pulse 55  Temp 97.6 F (36.4 C) (Oral)  Resp 16  SpO2 100%  Physical Exam  Nursing note and vitals reviewed. Constitutional: He appears well-developed and well-nourished.  HENT:  Head: Normocephalic and atraumatic.  Mouth/Throat: Oropharynx is clear and moist.   Eyes: Pupils are equal, round, and reactive to light.  Cardiovascular: Normal rate, regular rhythm and normal heart sounds.  Exam reveals no gallop and no friction rub.   No murmur heard. Pulmonary/Chest: Breath sounds normal. No respiratory distress. He has no wheezes.  Abdominal: Soft. Bowel sounds are normal. He exhibits no distension. There is no tenderness. There is no guarding.  Skin: Skin is warm and dry.    ED Course  Procedures (including critical care time)   Labs Reviewed  CBC WITH DIFFERENTIAL  COMPREHENSIVE METABOLIC PANEL  URINALYSIS, ROUTINE W REFLEX MICROSCOPIC   Patient will be referred to urology for further evaluation and care.  On reexamination, the patient, is mostly pain-free and feeling considerably better.  Patient, states that he and his wife normally lives in [place], 9 months out of the year and will be leaving on Tuesday.  Advised to call for followup to see if they can be seen on Monday.   MDM  MDM Reviewed: nursing note and vitals Interpretation: labs and CT scan            Carlyle Dolly, PA-C 02/10/12 0920  Carlyle Dolly, PA-C 02/10/12 1150

## 2012-02-10 NOTE — ED Notes (Signed)
Pt to ED c/o acute onset R flank/side pain and dysuria since midnight.  Hx of stage 4 liver disease, but denies hx of kidney stones.

## 2012-02-10 NOTE — ED Provider Notes (Signed)
Medical screening examination/treatment/procedure(s) were performed by non-physician practitioner and as supervising physician I was immediately available for consultation/collaboration.   Lyanne Co, MD 02/10/12 [phone]

## 2012-02-14 NOTE — ED Provider Notes (Signed)
Medical screening examination/treatment/procedure(s) were performed by non-physician practitioner and as supervising physician I was immediately available for consultation/collaboration.  Josaiah Muhammed, MD 02/14/12 1356 

## 2012-08-21 ENCOUNTER — Other Ambulatory Visit (HOSPITAL_COMMUNITY): Payer: Self-pay | Admitting: *Deleted

## 2012-08-22 ENCOUNTER — Encounter (HOSPITAL_COMMUNITY)
Admission: RE | Admit: 2012-08-22 | Discharge: 2012-08-22 | Disposition: A | Discharge status: Home / Self Care | Payer: Medicare Other | Source: Ambulatory Visit | Attending: Internal Medicine | Admitting: Internal Medicine

## 2012-08-22 DIAGNOSIS — M810 Age-related osteoporosis without current pathological fracture: Secondary | ICD-10-CM | POA: Insufficient documentation

## 2012-08-22 MED ORDER — DENOSUMAB 60 MG/ML ~~LOC~~ SOLN
60.0000 mg | Freq: Once | SUBCUTANEOUS | Status: AC
  Administered 2012-08-22: 60 mg via SUBCUTANEOUS
  Filled 2012-08-22 (×2): qty 1

## 2012-09-16 ENCOUNTER — Encounter (HOSPITAL_BASED_OUTPATIENT_CLINIC_OR_DEPARTMENT_OTHER): Payer: Self-pay | Admitting: *Deleted

## 2012-09-16 ENCOUNTER — Emergency Department (HOSPITAL_BASED_OUTPATIENT_CLINIC_OR_DEPARTMENT_OTHER)
Admission: EM | Admit: 2012-09-16 | Discharge: 2012-09-16 | Disposition: A | Discharge status: Home / Self Care | Payer: Medicare Other | Attending: Emergency Medicine | Admitting: Emergency Medicine

## 2012-09-16 DIAGNOSIS — Z8619 Personal history of other infectious and parasitic diseases: Secondary | ICD-10-CM | POA: Insufficient documentation

## 2012-09-16 DIAGNOSIS — M79609 Pain in unspecified limb: Secondary | ICD-10-CM | POA: Insufficient documentation

## 2012-09-16 DIAGNOSIS — Z87891 Personal history of nicotine dependence: Secondary | ICD-10-CM | POA: Insufficient documentation

## 2012-09-16 DIAGNOSIS — Z8719 Personal history of other diseases of the digestive system: Secondary | ICD-10-CM | POA: Insufficient documentation

## 2012-09-16 DIAGNOSIS — J449 Chronic obstructive pulmonary disease, unspecified: Secondary | ICD-10-CM | POA: Insufficient documentation

## 2012-09-16 DIAGNOSIS — R21 Rash and other nonspecific skin eruption: Secondary | ICD-10-CM

## 2012-09-16 DIAGNOSIS — Z79899 Other long term (current) drug therapy: Secondary | ICD-10-CM | POA: Insufficient documentation

## 2012-09-16 DIAGNOSIS — M25519 Pain in unspecified shoulder: Secondary | ICD-10-CM | POA: Insufficient documentation

## 2012-09-16 DIAGNOSIS — R51 Headache: Secondary | ICD-10-CM | POA: Insufficient documentation

## 2012-09-16 DIAGNOSIS — M542 Cervicalgia: Secondary | ICD-10-CM | POA: Insufficient documentation

## 2012-09-16 DIAGNOSIS — J4489 Other specified chronic obstructive pulmonary disease: Secondary | ICD-10-CM | POA: Insufficient documentation

## 2012-09-16 HISTORY — DX: Zoster without complications: B02.9

## 2012-09-16 MED ORDER — OXYCODONE HCL 5 MG PO TABS: 5.0000 mg | ORAL_TABLET | Freq: Four times a day (QID) | ORAL | Status: DC | PRN

## 2012-09-16 MED ORDER — VALACYCLOVIR HCL 500 MG PO TABS
1000.0000 mg | ORAL_TABLET | Freq: Once | ORAL | Status: DC
  Filled 2012-09-16: qty 2

## 2012-09-16 MED ORDER — OXYCODONE HCL 5 MG PO TABS
5.0000 mg | ORAL_TABLET | Freq: Once | ORAL | Status: AC
  Administered 2012-09-16: 5 mg via ORAL
  Filled 2012-09-16: qty 1

## 2012-09-16 MED ORDER — VALACYCLOVIR HCL 1 G PO TABS: 1000.0000 mg | ORAL_TABLET | Freq: Three times a day (TID) | ORAL | Status: AC

## 2012-09-16 NOTE — ED Notes (Signed)
D/c home with ride- rx x 2 given for valtrex and oxycodone

## 2012-09-16 NOTE — ED Provider Notes (Signed)
CSN: [id number]     Arrival date & time 09/16/12  1508 History  This chart was scribed for Ricardo Munch, MD by Ardelia Mems, ED Scribe. This patient was seen in room MH09/MH09 and the patient's care was started at 3:35 PM.   First MD Initiated Contact with Patient 09/16/12 1527     Chief Complaint  Patient presents with  . Rash    The history is provided by the patient and a relative. No language interpreter was used.   HPI Comments: [name] is a 77 y.o. male with a history of COPD, end stage liver disease and possible prior shingles who presents to the Emergency Department complaining of a gradual onset, gradually worsening rash to his left forearm onset about 5 days ago. He states that the rash is painful and he likens this pain, as well as sharp pains in his left axilla and shoulder, to pain from a previous shingles infection. He also states that he has had pain in his neck and the back of his head for about 2 weeks. He states that his left-sided pain occasionally radiates to his left leg. He states that in March 2014, while living in [place], he sustained a broken left shoulder, a compression fracture in his spine and 5 broken ribs on the left side after a fall. He states that a couple months ago, he came down with shingles, but he states that he was treated before a rash presented, and that to his knowledge, he recovered. Pt states that he has been doing Physical Therapy 2 times a week since the fall. He denies fever, chills, confusion, bladder or bowel incontinence, syncope, nausea, vomiting, diarrhea or any other symptoms. Pt is a former smoker and an occasional alcohol user.   Past Medical History  Diagnosis Date  . COPD (chronic obstructive pulmonary disease)   . Liver failure   . Shingles    Past Surgical History  Procedure Laterality Date  . Cholecystectomy    . Ercp    . Hemmrhoid      repair  . Back surgery     No family history on file. History  Substance Use  Topics  . Smoking status: Former Smoker    Types: Cigarettes  . Smokeless tobacco: Not on file  . Alcohol Use: Yes     Comment: occasional    Review of Systems  Constitutional: Negative for fever and chills.       Per HPI, otherwise negative  HENT:       Per HPI, otherwise negative  Respiratory:       Per HPI, otherwise negative  Cardiovascular:       Per HPI, otherwise negative  Gastrointestinal: Negative for nausea, vomiting and diarrhea.  Endocrine:       Negative aside from HPI  Genitourinary:       Neg aside from HPI   Musculoskeletal:       Per HPI, otherwise negative  Skin: Positive for rash.  Neurological: Negative for syncope.  Psychiatric/Behavioral: Negative for confusion.  All other systems reviewed and are negative.    Allergies  Augmentin; Eggs or egg-derived products; and Statins  Home Medications   Current Outpatient Rx  Name  Route  Sig  Dispense  Refill  . citalopram (CELEXA) 20 MG tablet   Oral   Take 10 mg by mouth daily.          . furosemide (LASIX) 40 MG tablet   Oral   Take 40 mg  by mouth daily.         . nadolol (CORGARD) 40 MG tablet   Oral   Take 20 mg by mouth daily.          . Psyllium (METAMUCIL PO)   Oral   Take 1 packet by mouth daily.         Marland Kitchen spironolactone (ALDACTONE) 25 MG tablet   Oral   Take 25 mg by mouth daily.         . ursodiol (ACTIGALL) 300 MG capsule   Oral   Take 600 mg by mouth 2 (two) times daily.          . Cholecalciferol (VITAMIN D PO)   Oral   Take 1 tablet by mouth daily.         Marland Kitchen liothyronine (CYTOMEL) 5 MCG tablet   Oral   Take 5 mcg by mouth daily.         Marland Kitchen oxyCODONE (ROXICODONE) 5 MG immediate release tablet   Oral   Take 1 tablet (5 mg total) by mouth every 6 (six) hours as needed for pain.   15 tablet   0   . potassium chloride (K-DUR,KLOR-CON) 10 MEQ tablet   Oral   Take 20 mEq by mouth 2 (two) times daily.         . promethazine (PHENERGAN) 25 MG tablet    Oral   Take 1 tablet (25 mg total) by mouth every 6 (six) hours as needed for nausea.   10 tablet   0    Triage Vitals: BP 119/73  Pulse 88  Temp(Src) 97.8 F (36.6 C) (Oral)  Resp 16  Ht 5\' 9"  (1.753 m)  Wt 172 lb (78.019 kg)  BMI 25.39 kg/m2  SpO2 100%  Physical Exam  Nursing note and vitals reviewed. Constitutional: He is oriented to person, place, and time. He appears well-developed. No distress.  HENT:  Head: Normocephalic and atraumatic.  Eyes: Conjunctivae and EOM are normal.  Neck:  No midline or paraspinal tenderness in the neck.  Cardiovascular: Normal rate and regular rhythm.   Pulmonary/Chest: Effort normal. No stridor. No respiratory distress.  Abdominal: He exhibits no distension.  Musculoskeletal: He exhibits no edema.  Neurological: He is alert and oriented to person, place, and time.  Extremities are neurovascularly intact.  Skin: Skin is warm and dry.  On the anterior lateral left forearm, there are many vesicular lesions of varying ages, without pus or confluence.  Psychiatric: He has a normal mood and affect.    ED Course   Procedures (including critical care time)  DIAGNOSTIC STUDIES: Oxygen Saturation is 100% on RA, normal by my interpretation.    COORDINATION OF CARE: 3:49 PM- Pt and relatives advised of plan to treat his symptoms as suspected shingles and pt and pt and relative agree with this, as well as with plan to follow up with PCP.   Labs Reviewed - No data to display  No results found.  1. Rash     MDM   I personally performed the services described in this documentation, which was scribed in my presence. The recorded information has been reviewed and is accurate.   The patient presents with concern for rash, left axillary pain.  Patient is afebrile, awake and alert, oriented x3.  He is neurovascularly intact.  Was description of sharp pain in a dermatomal distribution, the new rash, patient was treated for shingles, though his  pain may be secondary to prior trauma, and  the rash may be caused by dermatitis.  However, analgesia is appropriate in either case, and antiviral therapy is unlikely to be detrimental. Return precautions provided patient discharged in stable condition.  Ricardo Munch, MD 09/16/12 [phone]

## 2012-09-16 NOTE — ED Notes (Signed)
Pt reports worsening rash on left arm that has been present for three days, headache, sharp pain in left axilla and shoulder.

## 2012-09-25 ENCOUNTER — Other Ambulatory Visit: Payer: Self-pay | Admitting: Internal Medicine

## 2012-09-26 ENCOUNTER — Ambulatory Visit
Admission: RE | Admit: 2012-09-26 | Discharge: 2012-09-26 | Disposition: A | Discharge status: Home / Self Care | Payer: Medicare Other | Source: Ambulatory Visit | Attending: Internal Medicine | Admitting: Internal Medicine

## 2012-09-26 DIAGNOSIS — R519 Headache, unspecified: Secondary | ICD-10-CM

## 2014-08-23 ENCOUNTER — Encounter (HOSPITAL_COMMUNITY): Payer: Self-pay | Admitting: Oncology

## 2014-08-23 ENCOUNTER — Emergency Department (HOSPITAL_COMMUNITY)
Admission: EM | Admit: 2014-08-23 | Discharge: 2014-08-24 | Disposition: A | Discharge status: Home / Self Care | Payer: Medicare Other | Attending: Emergency Medicine | Admitting: Emergency Medicine

## 2014-08-23 DIAGNOSIS — Y929 Unspecified place or not applicable: Secondary | ICD-10-CM | POA: Diagnosis not present

## 2014-08-23 DIAGNOSIS — Y939 Activity, unspecified: Secondary | ICD-10-CM | POA: Insufficient documentation

## 2014-08-23 DIAGNOSIS — S2231XA Fracture of one rib, right side, initial encounter for closed fracture: Secondary | ICD-10-CM | POA: Diagnosis not present

## 2014-08-23 DIAGNOSIS — J449 Chronic obstructive pulmonary disease, unspecified: Secondary | ICD-10-CM | POA: Insufficient documentation

## 2014-08-23 DIAGNOSIS — S0181XA Laceration without foreign body of other part of head, initial encounter: Secondary | ICD-10-CM | POA: Diagnosis not present

## 2014-08-23 DIAGNOSIS — Z880 Allergy status to penicillin: Secondary | ICD-10-CM | POA: Insufficient documentation

## 2014-08-23 DIAGNOSIS — S0292XA Unspecified fracture of facial bones, initial encounter for closed fracture: Secondary | ICD-10-CM

## 2014-08-23 DIAGNOSIS — Y999 Unspecified external cause status: Secondary | ICD-10-CM | POA: Diagnosis not present

## 2014-08-23 DIAGNOSIS — T148XXA Other injury of unspecified body region, initial encounter: Secondary | ICD-10-CM

## 2014-08-23 DIAGNOSIS — Z8619 Personal history of other infectious and parasitic diseases: Secondary | ICD-10-CM | POA: Diagnosis not present

## 2014-08-23 DIAGNOSIS — Z87891 Personal history of nicotine dependence: Secondary | ICD-10-CM | POA: Insufficient documentation

## 2014-08-23 DIAGNOSIS — W108XXA Fall (on) (from) other stairs and steps, initial encounter: Secondary | ICD-10-CM | POA: Diagnosis not present

## 2014-08-23 DIAGNOSIS — S0993XA Unspecified injury of face, initial encounter: Secondary | ICD-10-CM | POA: Diagnosis present

## 2014-08-23 DIAGNOSIS — W19XXXA Unspecified fall, initial encounter: Secondary | ICD-10-CM

## 2014-08-23 DIAGNOSIS — Z79899 Other long term (current) drug therapy: Secondary | ICD-10-CM | POA: Insufficient documentation

## 2014-08-23 DIAGNOSIS — S028XXA Fractures of other specified skull and facial bones, initial encounter for closed fracture: Secondary | ICD-10-CM | POA: Diagnosis not present

## 2014-08-23 DIAGNOSIS — Z792 Long term (current) use of antibiotics: Secondary | ICD-10-CM | POA: Diagnosis not present

## 2014-08-23 DIAGNOSIS — S50811A Abrasion of right forearm, initial encounter: Secondary | ICD-10-CM | POA: Diagnosis not present

## 2014-08-23 DIAGNOSIS — S022XXA Fracture of nasal bones, initial encounter for closed fracture: Secondary | ICD-10-CM | POA: Diagnosis not present

## 2014-08-23 HISTORY — DX: Fracture of one rib, unspecified side, initial encounter for closed fracture: S22.39XA

## 2014-08-23 HISTORY — DX: Repeated falls: R29.6

## 2014-08-23 HISTORY — DX: Ulcerative colitis, unspecified, without complications: K51.90

## 2014-08-23 NOTE — ED Notes (Signed)
Pt transported from home after missing bottom two steps, falling forward hitting face on wall. Lacerations noted to bridge of nose and forehead. Large skin tear to R forearm. Bandaged by EMS

## 2014-08-24 ENCOUNTER — Emergency Department (HOSPITAL_COMMUNITY): Payer: Medicare Other

## 2014-08-24 DIAGNOSIS — S022XXA Fracture of nasal bones, initial encounter for closed fracture: Secondary | ICD-10-CM | POA: Diagnosis not present

## 2014-08-24 LAB — CBC WITH DIFFERENTIAL/PLATELET
Basophils Absolute: 0 10*3/uL (ref 0.0–0.1)
Basophils Relative: 0 % (ref 0–1)
EOS ABS: 0.3 10*3/uL (ref 0.0–0.7)
Eosinophils Relative: 4 % (ref 0–5)
HCT: 39.7 % (ref 39.0–52.0)
Hemoglobin: 13 g/dL (ref 13.0–17.0)
LYMPHS ABS: 0.8 10*3/uL (ref 0.7–4.0)
LYMPHS PCT: 11 % — AB (ref 12–46)
MCH: 29.4 pg (ref 26.0–34.0)
MCHC: 32.7 g/dL (ref 30.0–36.0)
MCV: 89.8 fL (ref 78.0–100.0)
Monocytes Absolute: 0.9 10*3/uL (ref 0.1–1.0)
Monocytes Relative: 11 % (ref 3–12)
NEUTROS PCT: 74 % (ref 43–77)
Neutro Abs: 5.6 10*3/uL (ref 1.7–7.7)
PLATELETS: 105 10*3/uL — AB (ref 150–400)
RBC: 4.42 MIL/uL (ref 4.22–5.81)
RDW: 15.4 % (ref 11.5–15.5)
WBC: 7.5 10*3/uL (ref 4.0–10.5)

## 2014-08-24 LAB — COMPREHENSIVE METABOLIC PANEL
ALT: 20 U/L (ref 17–63)
AST: 39 U/L (ref 15–41)
Albumin: 2.9 g/dL — ABNORMAL LOW (ref 3.5–5.0)
Alkaline Phosphatase: 210 U/L — ABNORMAL HIGH (ref 38–126)
Anion gap: 8 (ref 5–15)
BILIRUBIN TOTAL: 1.1 mg/dL (ref 0.3–1.2)
BUN: 19 mg/dL (ref 6–20)
CHLORIDE: 103 mmol/L (ref 101–111)
CO2: 28 mmol/L (ref 22–32)
CREATININE: 0.86 mg/dL (ref 0.61–1.24)
Calcium: 9 mg/dL (ref 8.9–10.3)
GFR calc Af Amer: >60 mL/min (ref >60)
Glucose, Bld: 90 mg/dL (ref 65–99)
Potassium: 4.1 mmol/L (ref 3.5–5.1)
Sodium: 139 mmol/L (ref 135–145)
Total Protein: 6.9 g/dL (ref 6.5–8.1)

## 2014-08-24 LAB — PROTIME-INR
INR: 1.04 (ref 0.00–1.49)
Prothrombin Time: 13.8 seconds (ref 11.6–15.2)

## 2014-08-24 MED ORDER — OXYCODONE HCL 5 MG PO TABS: 5.0000 mg | ORAL_TABLET | ORAL | Status: AC | PRN

## 2014-08-24 MED ORDER — TRIPLE ANTIBIOTIC 5-400-5000 EX OINT: TOPICAL_OINTMENT | Freq: Four times a day (QID) | CUTANEOUS | Status: AC

## 2014-08-24 MED ORDER — CEPHALEXIN 500 MG PO CAPS: 500.0000 mg | ORAL_CAPSULE | Freq: Four times a day (QID) | ORAL | Status: AC

## 2014-08-24 MED ORDER — FENTANYL CITRATE (PF) 100 MCG/2ML IJ SOLN
50.0000 ug | Freq: Once | INTRAMUSCULAR | Status: AC
  Administered 2014-08-24: 50 ug via INTRAVENOUS
  Filled 2014-08-24: qty 2

## 2014-08-24 MED ORDER — BACITRACIN ZINC 500 UNIT/GM EX OINT
TOPICAL_OINTMENT | CUTANEOUS | Status: AC
  Filled 2014-08-24: qty 4.5

## 2014-08-24 NOTE — ED Notes (Signed)
Patient transported to CT 

## 2014-08-24 NOTE — Discharge Instructions (Signed)
We saw you in the ER for your WOUNDS post fall. Please read the instructions provided on wound care. Keep the area clean and dry, apply bacitracin ointment daily and take the medications provided. RETURN TO THE ER IF THERE IS INCREASED PAIN, REDNESS, PUS COMING OUT from the wound site.  You also have facial fractures. Take the antibiotics prescribed to prevent infection of the face and other wounds.   Abrasion An abrasion is a cut or scrape of the skin. Abrasions do not extend through all layers of the skin and most heal within 10 days. It is important to care for your abrasion properly to prevent infection. CAUSES  Most abrasions are caused by falling on, or gliding across, the ground or other surface. When your skin rubs on something, the outer and inner layer of skin rubs off, causing an abrasion. DIAGNOSIS  Your caregiver will be able to diagnose an abrasion during a physical exam.  TREATMENT  Your treatment depends on how large and deep the abrasion is. Generally, your abrasion will be cleaned with water and a mild soap to remove any dirt or debris. An antibiotic ointment may be put over the abrasion to prevent an infection. A bandage (dressing) may be wrapped around the abrasion to keep it from getting dirty.  You may need a tetanus shot if:  You cannot remember when you had your last tetanus shot.  You have never had a tetanus shot.  The injury broke your skin. If you get a tetanus shot, your arm may swell, get red, and feel warm to the touch. This is common and not a problem. If you need a tetanus shot and you choose not to have one, there is a rare chance of getting tetanus. Sickness from tetanus can be serious.  HOME CARE INSTRUCTIONS   If a dressing was applied, change it at least once a day or as directed by your caregiver. If the bandage sticks, soak it off with warm water.   Wash the area with water and a mild soap to remove all the ointment 2 times a day. Rinse off the  soap and pat the area dry with a clean towel.   Reapply any ointment as directed by your caregiver. This will help prevent infection and keep the bandage from sticking. Use gauze over the wound and under the dressing to help keep the bandage from sticking.   Change your dressing right away if it becomes wet or dirty.   Only take over-the-counter or prescription medicines for pain, discomfort, or fever as directed by your caregiver.   Follow up with your caregiver within 24-48 hours for a wound check, or as directed. If you were not given a wound-check appointment, look closely at your abrasion for redness, swelling, or pus. These are signs of infection. SEEK IMMEDIATE MEDICAL CARE IF:   You have increasing pain in the wound.   You have redness, swelling, or tenderness around the wound.   You have pus coming from the wound.   You have a fever or persistent symptoms for more than 2-3 days.  You have a fever and your symptoms suddenly get worse.  You have a bad smell coming from the wound or dressing.  MAKE SURE YOU:   Understand these instructions.  Will watch your condition.  Will get help right away if you are not doing well or get worse. Document Released: 11/10/2004 Document Revised: 01/18/2012 Document Reviewed: 01/04/2011 [facility] Patient Information 2015 [place], [place]. This information is  not intended to replace advice given to you by your health care provider. Make sure you discuss any questions you have with your health care provider.  Nasal Fracture A nasal fracture is a break or crack in the bones of the nose. A minor break usually heals in a month. You often will receive black eyes from a nasal fracture. This is not a cause for concern. The black eyes will go away over 1 to 2 weeks.  DIAGNOSIS  Your caregiver may want to examine you if you are concerned about a fracture of the nose. X-rays of the nose may not show a nasal fracture even when one is present.  Sometimes your caregiver must wait 1 to 5 days after the injury to re-check the nose for alignment and to take additional X-rays. Sometimes the caregiver must wait until the swelling has gone down. TREATMENT Minor fractures that have caused no deformity often do not require treatment. More serious fractures where bones are displaced may require surgery. This will take place after the swelling is gone. Surgery will stabilize and align the fracture. HOME CARE INSTRUCTIONS   Put ice on the injured area.  Put ice in a plastic bag.  Place a towel between your skin and the bag.  Leave the ice on for 15-20 minutes, 03-04 times a day.  Take medications as directed by your caregiver.  Only take over-the-counter or prescription medicines for pain, discomfort, or fever as directed by your caregiver.  If your nose starts bleeding, squeeze the soft parts of the nose against the center wall while you are sitting in an upright position for 10 minutes.  Contact sports should be avoided for at least 3 to 4 weeks or as directed by your caregiver. SEEK MEDICAL CARE IF:  Your pain increases or becomes severe.  You continue to have nosebleeds.  The shape of your nose does not return to normal within 5 days.  You have pus draining from the nose. SEEK IMMEDIATE MEDICAL CARE IF:   You have bleeding from your nose that does not stop after 20 minutes of pinching the nostrils closed and keeping ice on the nose.  You have clear fluid draining from your nose.  You notice a grape-like swelling on the dividing wall between the nostrils (septum). This is a collection of blood (hematoma) that must be drained to help prevent infection.  You have difficulty moving your eyes.  You have recurrent vomiting. Document Released: 01/29/2000 Document Revised: 04/25/2011 Document Reviewed: 05/17/2010 [facility] Patient Information 2015 [place]. This information is not intended to replace advice given to you by  your health care provider. Make sure you discuss any questions you have with your health care provider.

## 2014-08-24 NOTE — ED Notes (Signed)
Pt ambulating independently w/ steady gait on d/c in no acute distress, A&Ox4. D/c instructions reviewed w/ pt and family - pt and family deny any further questions or concerns at present. Rx given x3  

## 2014-08-24 NOTE — ED Provider Notes (Signed)
CSN: [id number]     Arrival date & time 08/23/14  2243 History   First MD Initiated Contact with Patient 08/23/14 2308     Chief Complaint  Patient presents with  . Fall     (Consider location/radiation/quality/duration/timing/severity/associated sxs/prior Treatment) HPI Comments: Pt comes in post fall. Pt has hx of liver cirrhosis. He was coming down the stairs and fell. He is not sure why he fell. He recalls hitting his face to the hard wood floor, there was no syncope. Pt has pain in his head, face, right ribs and right forearm. He is not on any anticoagulants.    Patient is a 79 y.o. male presenting with fall. The history is provided by the patient.  Fall Associated symptoms include chest pain. Pertinent negatives include no abdominal pain and no shortness of breath.    Past Medical History  Diagnosis Date  . COPD (chronic obstructive pulmonary disease)   . Liver failure   . Shingles   . Ulcerative colitis   . Falls frequently   . Rib fracture     b/l rib fx d/t frequent falls   Past Surgical History  Procedure Laterality Date  . Cholecystectomy    . Ercp    . Hemmrhoid      repair  . Back surgery     No family history on file. History  Substance Use Topics  . Smoking status: Former Smoker    Types: Cigarettes  . Smokeless tobacco: Not on file  . Alcohol Use: Yes     Comment: occasional    Review of Systems  Constitutional: Negative for activity change and appetite change.  Eyes: Negative for visual disturbance.  Respiratory: Negative for cough and shortness of breath.   Cardiovascular: Positive for chest pain.  Gastrointestinal: Negative for abdominal pain.  Genitourinary: Negative for dysuria.  Musculoskeletal: Positive for myalgias and arthralgias.  Skin: Positive for rash and wound.  Hematological: Bruises/bleeds easily.  All other systems reviewed and are negative.     Allergies  Augmentin; Eggs or egg-derived products; and Statins  Home  Medications   Prior to Admission medications   Medication Sig Start Date End Date Taking? Authorizing Provider  albuterol (PROVENTIL HFA;VENTOLIN HFA) 108 (90 BASE) MCG/ACT inhaler Inhale 1-2 puffs into the lungs every 6 (six) hours as needed for wheezing or shortness of breath.   Yes Historical Provider, MD  cephALEXin (KEFLEX) 500 MG capsule Take 1 capsule (500 mg total) by mouth 4 (four) times daily. 08/24/14   Derwood Kaplan, MD  Cholecalciferol (VITAMIN D PO) Take 5,000 Units by mouth daily.    Yes Historical Provider, MD  citalopram (CELEXA) 20 MG tablet Take 20 mg by mouth daily.    Yes Historical Provider, MD  furosemide (LASIX) 20 MG tablet Take 20 mg by mouth daily. 08/20/14  Yes Historical Provider, MD  Lutein-Zeaxanthin 25-5 MG CAPS Take 1 capsule by mouth daily.   Yes Historical Provider, MD  Multiple Vitamin (MULTIVITAMIN WITH MINERALS) TABS tablet Take 1 tablet by mouth daily. Centrum Silver   Yes Historical Provider, MD  Multiple Vitamins-Minerals (PRESERVISION AREDS 2 PO) Take 1 tablet by mouth daily.   Yes Historical Provider, MD  nadolol (CORGARD) 20 MG tablet Take 1 tablet by mouth daily. 07/04/14  Yes Historical Provider, MD  neomycin-bacitracin-polymyxin (NEOSPORIN) [phone] ointment Apply topically 4 (four) times daily. 08/24/14   Derwood Kaplan, MD  oxyCODONE (ROXICODONE) 5 MG immediate release tablet Take 1 tablet (5 mg total) by mouth every 4 (four)  hours as needed for severe pain. 08/24/14   Derwood Kaplan, MD  SPIRIVA RESPIMAT 2.5 MCG/ACT AERS Inhale 2 puffs into the lungs daily. 07/17/14  Yes Historical Provider, MD  spironolactone (ALDACTONE) 25 MG tablet Take 25 mg by mouth daily.   Yes Historical Provider, MD  SYMBICORT 160-4.5 MCG/ACT inhaler Take 2 puffs by mouth 2 (two) times daily. 07/17/14  Yes Historical Provider, MD  ursodiol (ACTIGALL) 300 MG capsule Take 300 mg by mouth 2 (two) times daily.    Yes Historical Provider, MD   BP 126/55 mmHg  Pulse 61  Temp(Src)  97.3 F (36.3 C) (Oral)  Resp 15  Ht 5\' 8"  (1.727 m)  Wt 170 lb (77.111 kg)  BMI 25.85 kg/m2  SpO2 94% Physical Exam  Constitutional: He is oriented to person, place, and time. He appears well-developed.  HENT:  Head: Normocephalic and atraumatic.  Eyes: Conjunctivae and EOM are normal. Pupils are equal, round, and reactive to light.  Neck: Normal range of motion. Neck supple.  Cardiovascular: Normal rate and regular rhythm.   Pulmonary/Chest: Effort normal and breath sounds normal.  Abdominal: Soft. Bowel sounds are normal. He exhibits no distension. There is no tenderness. There is no rebound and no guarding.  Musculoskeletal: He exhibits tenderness.  Pt has a 1 cm laceration over his forehead, skin tear over the nose and some ecchymoses over the nose. PT also has tenderness over the face, nose, Rt forearm, Rt chest wall.  Back and lower extremity exam is normal.  Neurological: He is alert and oriented to person, place, and time. No cranial nerve deficit.  Skin: Skin is warm. Rash noted.  Skin abrasion to the forearm, R  Nursing note and vitals reviewed.   ED Course  Wound closure utilizing adhes only Date/Time: 08/24/2014 2:33 AM Performed by: Derwood Kaplan Authorized by: Derwood Kaplan Consent: Verbal consent obtained. Risks and benefits: risks, benefits and alternatives were discussed Consent given by: patient Patient identity confirmed: arm band Local anesthesia used: no Patient sedated: no Patient tolerance: Patient tolerated the procedure well with no immediate complications Comments: Adhesives placed over the R eye brow region.    (including critical care time) Labs Review Labs Reviewed  CBC WITH DIFFERENTIAL/PLATELET - Abnormal; Notable for the following:    Platelets 105 (*)    Lymphocytes Relative 11 (*)    All other components within normal limits  COMPREHENSIVE METABOLIC PANEL - Abnormal; Notable for the following:    Albumin 2.9 (*)    Alkaline  Phosphatase 210 (*)    All other components within normal limits  PROTIME-INR    Imaging Review Dg Ribs Unilateral W/chest Right  08/24/2014   CLINICAL DATA:  Status post fall. Right anterior rib pain. Initial encounter.  EXAM: RIGHT RIBS AND CHEST - 3+ VIEW  COMPARISON:  CTA of the chest performed 02/01/2008  FINDINGS: There appear to be acute fractures through the right anterolateral seventh and eighth ribs. Underlying chronic right-sided rib deformities are seen. Chronic left-sided rib deformities are again noted.  The lungs are well-aerated. Mild vascular congestion is noted. There is no evidence of focal opacification, pleural effusion or pneumothorax.  The cardiomediastinal silhouette is within normal limits. No additional osseous abnormalities are seen. The patient is status post vertebroplasty at multiple levels along the lumbar spine. Clips are noted within the right upper quadrant, reflecting prior cholecystectomy.  IMPRESSION: 1. Suspect acute fractures through the right anterolateral seventh and eighth ribs. Underlying bilateral chronic rib deformities seen. 2. Mild vascular  congestion noted.   Electronically Signed   By: Roanna RaiderJeffery  Chang M.D.   On: 08/24/2014 01:31   Dg Cervical Spine Complete  08/24/2014   CLINICAL DATA:  Status post fall, with pain about the head and face. Initial encounter.  EXAM: CERVICAL SPINE  4+ VIEWS  COMPARISON:  None.  FINDINGS: There is no evidence of fracture or subluxation. Vertebral bodies demonstrate normal height and alignment. Multilevel disc space narrowing is noted along the cervical spine, with scattered anterior and posterior disc osteophyte complexes. Prevertebral soft tissues are within normal limits. The provided odontoid view demonstrates no significant abnormality.  The visualized lung apices are clear.  IMPRESSION: No evidence of fracture or subluxation along the cervical spine. Mild degenerative change noted along the cervical spine.   Electronically  Signed   By: Roanna RaiderJeffery  Chang M.D.   On: 08/24/2014 01:15   Dg Forearm Right  08/24/2014   CLINICAL DATA:  Fall with facial lacerations and right forearm laceration. Initial encounter.  EXAM: RIGHT FOREARM - 2 VIEW  COMPARISON:  None.  FINDINGS: There is no evidence of fracture or other focal bone lesions. The patient's laceration is not clearly discerned. No opaque foreign body.  IMPRESSION: No fracture or dislocation.   Electronically Signed   By: Marnee SpringJonathon  Watts M.D.   On: 08/24/2014 01:20   Ct Head Wo Contrast  08/24/2014   CLINICAL DATA:  Fall with nasal laceration.  Initial encounter.  EXAM: CT HEAD WITHOUT CONTRAST  CT MAXILLOFACIAL WITHOUT CONTRAST  TECHNIQUE: Multidetector CT imaging of the head and maxillofacial structures were performed using the standard protocol without intravenous contrast. Multiplanar CT image reconstructions of the maxillofacial structures were also generated.  COMPARISON:  Head CT 09/26/2012  FINDINGS: CT HEAD FINDINGS  Skull and Sinuses:Facial findings discussed below. No calvarial fracture. Prominent arachnoid granulations at the vertex are stable.  Brain: No evidence of acute infarction, hemorrhage, hydrocephalus, or mass lesion/mass effect. Generalized atrophy which is stable from 2014. Mild small vessel ischemic change around the lateral ventricles.  CT MAXILLOFACIAL FINDINGS  There is a laceration over the bridge of the nose with subcutaneous gas. Left nasal arch fracture with mild lateral displacement at the level of the anterior process maxilla. The nasal septum is fractured anteriorly and inferiorly, without displacement (rightward nasal deviation is chronic based on previous study).  New nondisplaced fracture through the lateral plate of the left pterygoid process (see coronal reformat). No complete LeFort injury.  Deformity of the medial wall left orbit is chronic based on previous study.  Bilateral cataract resection. No evidence of globe injury or postseptal  hematoma  IMPRESSION: 1. Nasal arch and septum fractures with mild displacement of the left nasal arch. 2. Nondisplaced left pterygoid process fracture. 3. No evidence of acute intracranial injury. 4. Atrophy and chronic small vessel disease which is stable from 2014.   Electronically Signed   By: Marnee SpringJonathon  Watts M.D.   On: 08/24/2014 00:54   Ct Maxillofacial Wo Cm  08/24/2014   CLINICAL DATA:  Fall with nasal laceration.  Initial encounter.  EXAM: CT HEAD WITHOUT CONTRAST  CT MAXILLOFACIAL WITHOUT CONTRAST  TECHNIQUE: Multidetector CT imaging of the head and maxillofacial structures were performed using the standard protocol without intravenous contrast. Multiplanar CT image reconstructions of the maxillofacial structures were also generated.  COMPARISON:  Head CT 09/26/2012  FINDINGS: CT HEAD FINDINGS  Skull and Sinuses:Facial findings discussed below. No calvarial fracture. Prominent arachnoid granulations at the vertex are stable.  Brain: No evidence of  acute infarction, hemorrhage, hydrocephalus, or mass lesion/mass effect. Generalized atrophy which is stable from 2014. Mild small vessel ischemic change around the lateral ventricles.  CT MAXILLOFACIAL FINDINGS  There is a laceration over the bridge of the nose with subcutaneous gas. Left nasal arch fracture with mild lateral displacement at the level of the anterior process maxilla. The nasal septum is fractured anteriorly and inferiorly, without displacement (rightward nasal deviation is chronic based on previous study).  New nondisplaced fracture through the lateral plate of the left pterygoid process (see coronal reformat). No complete LeFort injury.  Deformity of the medial wall left orbit is chronic based on previous study.  Bilateral cataract resection. No evidence of globe injury or postseptal hematoma  IMPRESSION: 1. Nasal arch and septum fractures with mild displacement of the left nasal arch. 2. Nondisplaced left pterygoid process fracture. 3. No  evidence of acute intracranial injury. 4. Atrophy and chronic small vessel disease which is stable from 2014.   Electronically Signed   By: Marnee Spring M.D.   On: 08/24/2014 00:54     EKG Interpretation   Date/Time:  Saturday August 23 2014 23:10:11 EDT Ventricular Rate:  59 PR Interval:  208 QRS Duration: 105 QT Interval:  451 QTC Calculation: 447 R Axis:   -30 Text Interpretation:  Sinus rhythm Left axis deviation Abnormal R-wave  progression, early transition Confirmed by DOCHERTY  MD, MEGAN ([id number]) on  08/23/2014 11:13:01 PM       MDM   Final diagnoses:  Fall  Nasal septum fracture, closed, initial encounter  Skin abrasion  Facial fracture due to fall, closed, initial encounter    DDx includes: - Mechanical falls - ICH - Fractures - Contusions - Soft tissue injury   Pt with fall. Has skin abrasions. Has headache and facial pain - and the CT scan shows some facial fractures. He has some nasal bleeding - so will give antibiotics. Pt also has nasal septum abrasion, but no bony is appreciated. Strict return precautions and wound care instructions discussed.     Derwood Kaplan, MD 08/24/14 1610

## 2016-03-20 IMAGING — CT CT MAXILLOFACIAL W/O CM
3 of 5 series · 16 of 47 positions shown, 19 images · non-contrast
Comparison: Head CT 09/26/2012

CLINICAL DATA: Fall with nasal laceration.  Initial encounter.

EXAM:
CT HEAD WITHOUT CONTRAST
CT MAXILLOFACIAL WITHOUT CONTRAST
TECHNIQUE: Multidetector CT imaging of the head and maxillofacial structures
were performed using the standard protocol without intravenous
contrast. Multiplanar CT image reconstructions of the maxillofacial
structures were also generated.

[Series 3: facial st · axial · 0.35mm/px · z∈[-248,-98]mm · 10 of 89 slices shown, 13 images]
[im 7/89  brain]
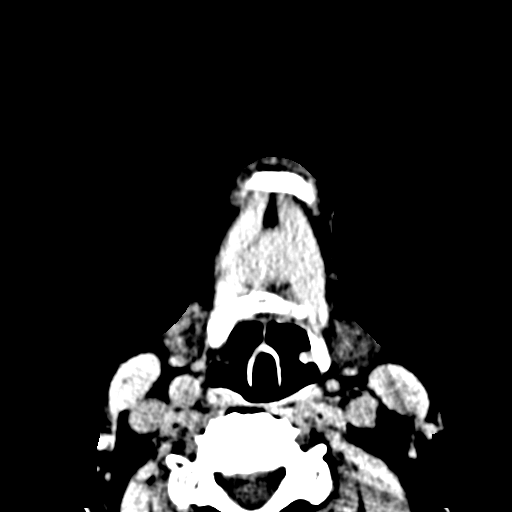
[im 7/89  bone]
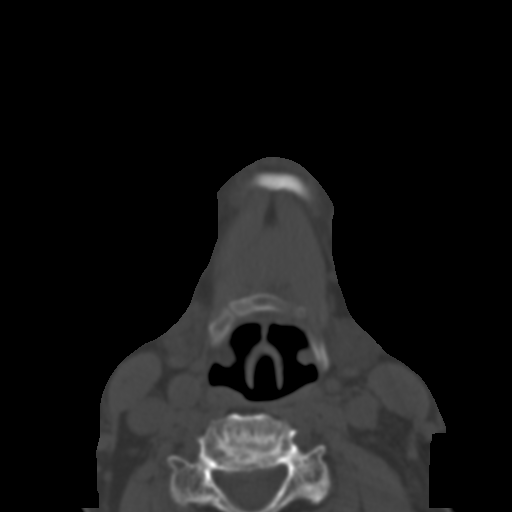
[im 13/89  bone]
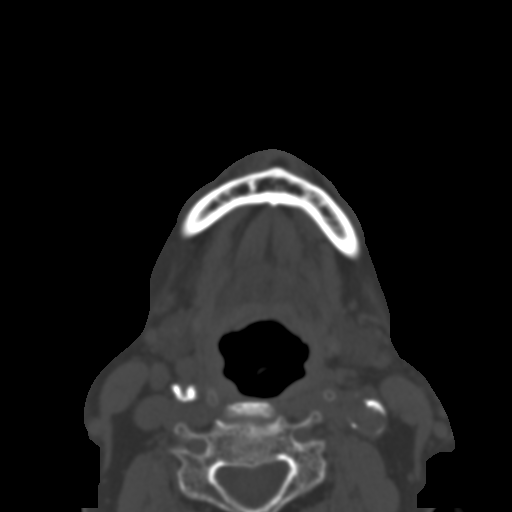
[im 26/89  bone]
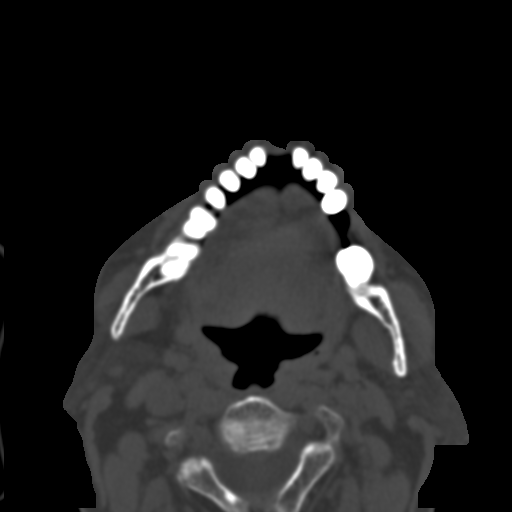
[im 32/89  bone]
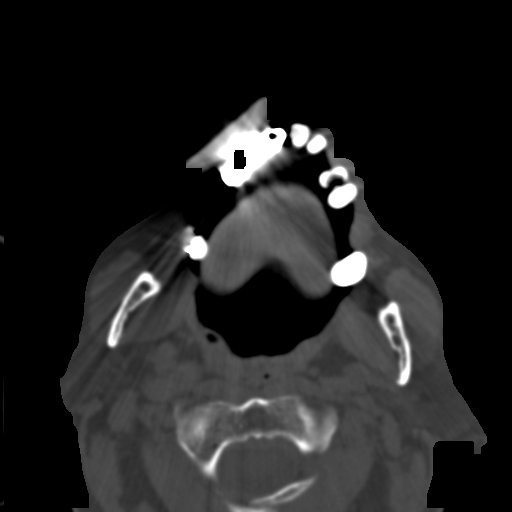
[im 38/89  brain]
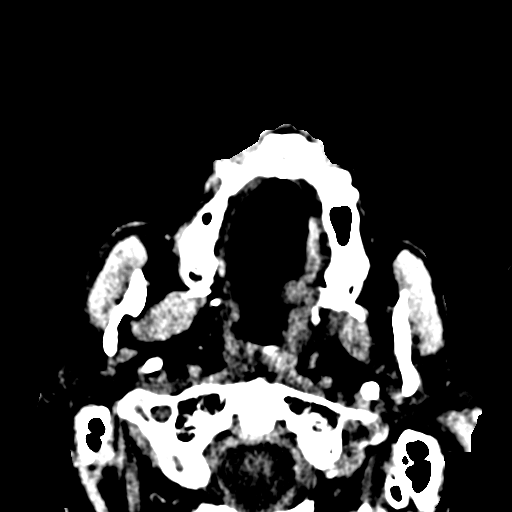
[im 38/89  bone]
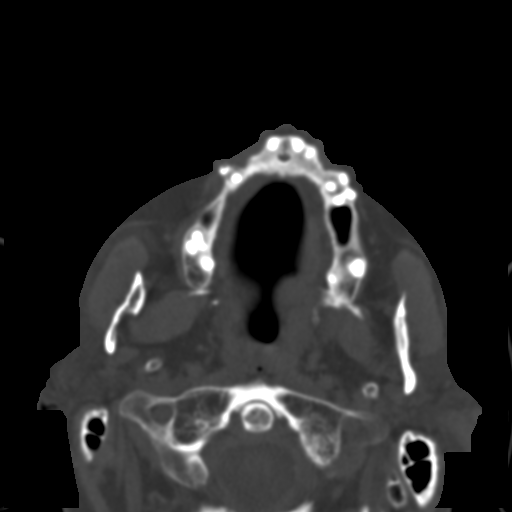
[im 51/89  bone]
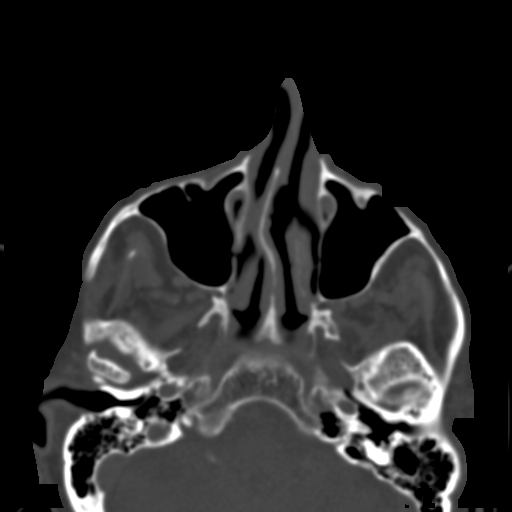
[im 57/89  bone]
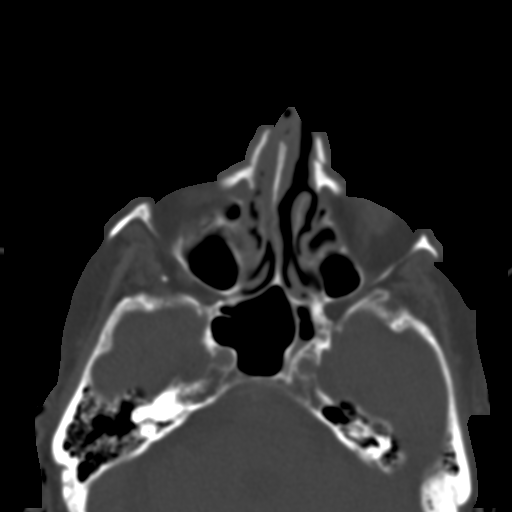
[im 63/89  bone]
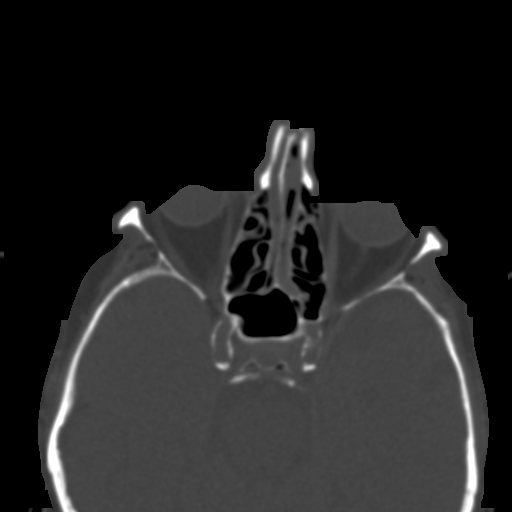
[im 76/89  brain]
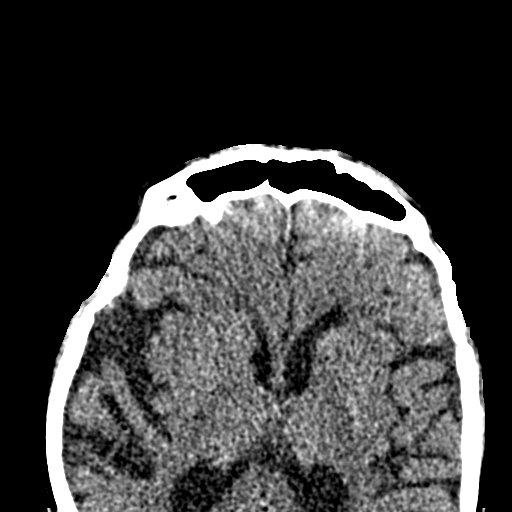
[im 76/89  bone]
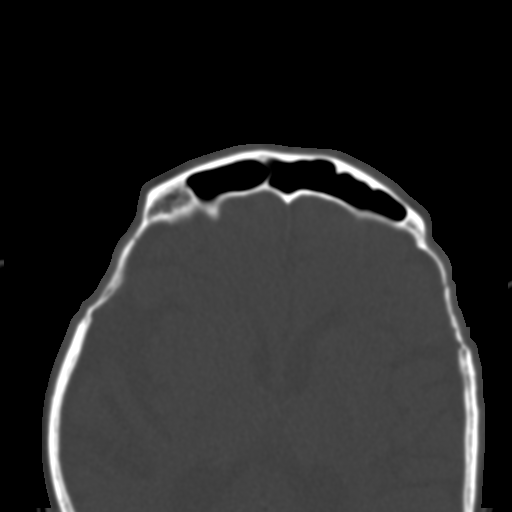
[im 82/89  bone]
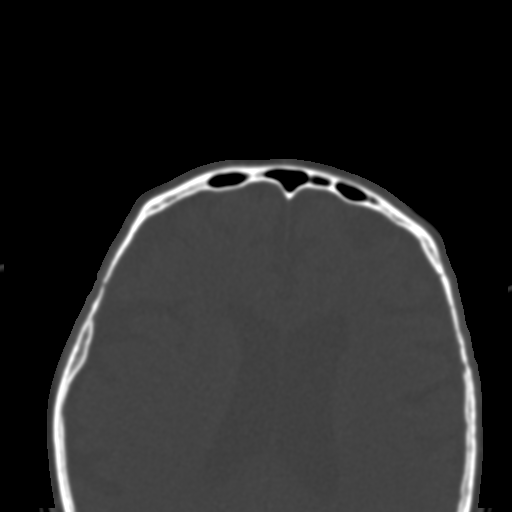

[Series 9: coronal st · coronal · 0.35mm/px · 3 of 76 slices shown]
[im 26/76  bone]
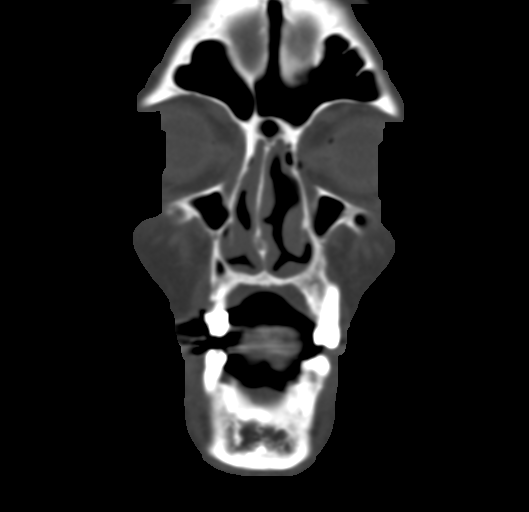
[im 34/76  bone]
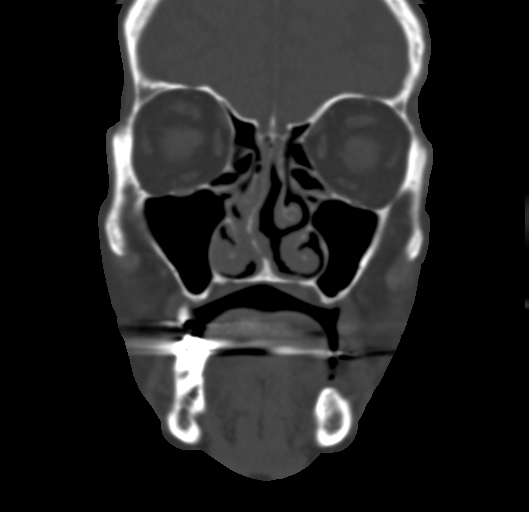
[im 42/76  bone]
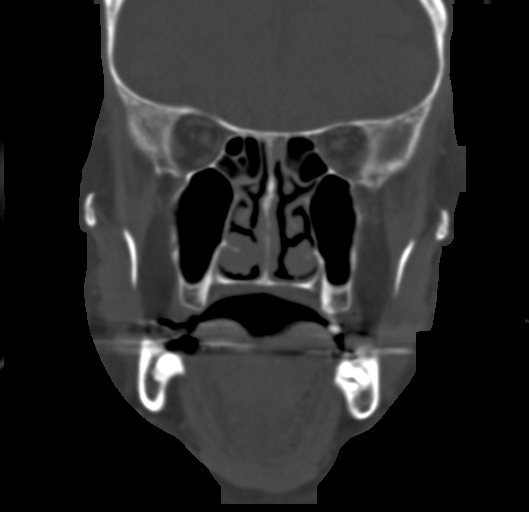

[Series 10: sagittal st · sagittal · 0.35mm/px · 3 of 83 slices shown]
[im 28/83  bone]
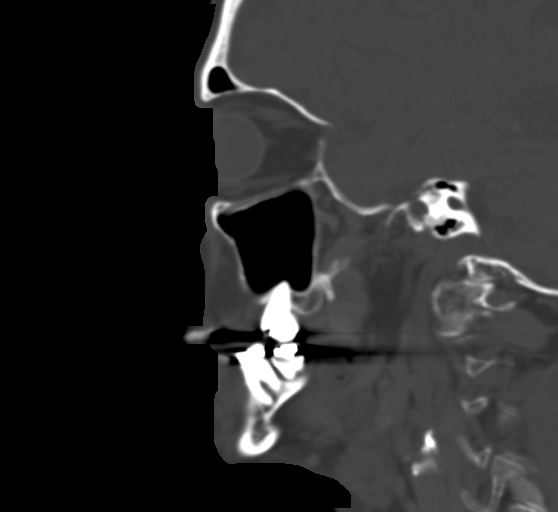
[im 42/83  bone]
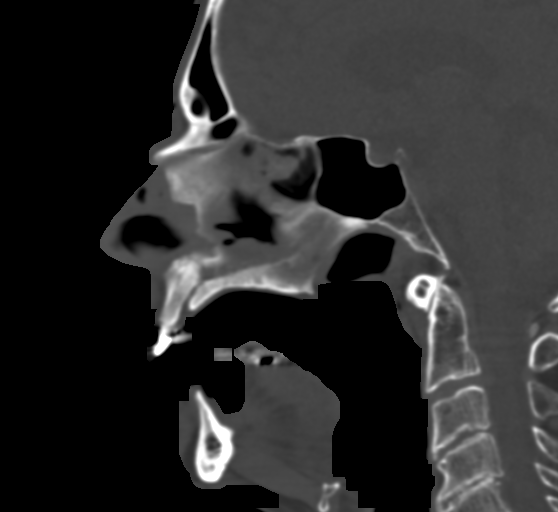
[im 55/83  bone]
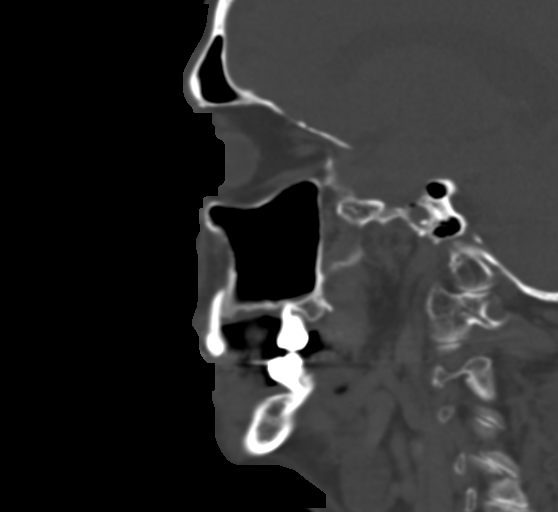

[16 of 47 positions shown; findings below may reference images not displayed]

FINDINGS: CT HEAD FINDINGS

Skull and Sinuses:Facial findings discussed below. No calvarial
fracture. Prominent arachnoid granulations at the vertex are stable.

Brain: No evidence of acute infarction, hemorrhage, hydrocephalus,
or mass lesion/mass effect. Generalized atrophy which is stable from
3529. Mild small vessel ischemic change around the lateral
ventricles.

CT MAXILLOFACIAL FINDINGS

There is a laceration over the bridge of the nose with subcutaneous
gas. Left nasal arch fracture with mild lateral displacement at the
level of the anterior process maxilla. The nasal septum is fractured
anteriorly and inferiorly, without displacement (rightward nasal
deviation is chronic based on previous study).

New nondisplaced fracture through the lateral plate of the left
pterygoid process (see coronal reformat). No complete Aileen injury.

Deformity of the medial wall left orbit is chronic based on previous
study.

Bilateral cataract resection. No evidence of globe injury or
postseptal hematoma
IMPRESSION: 1. Nasal arch and septum fractures with mild displacement of the
left nasal arch.
2. Nondisplaced left pterygoid process fracture.
3. No evidence of acute intracranial injury.
4. Atrophy and chronic small vessel disease which is stable from

## 2020-01-15 DEATH — deceased
